# Patient Record
Sex: Male | Born: 1960 | Race: White | Hispanic: No | Marital: Married | Smoking: Former smoker
Health system: Southern US, Community
[De-identification: ages and names within clinical notes are randomized; demographics above are authoritative.]

## PROBLEM LIST (undated history)

## (undated) DIAGNOSIS — M1990 Unspecified osteoarthritis, unspecified site: Secondary | ICD-10-CM

## (undated) DIAGNOSIS — I219 Acute myocardial infarction, unspecified: Secondary | ICD-10-CM

## (undated) DIAGNOSIS — Z9889 Other specified postprocedural states: Secondary | ICD-10-CM

## (undated) DIAGNOSIS — I2510 Atherosclerotic heart disease of native coronary artery without angina pectoris: Secondary | ICD-10-CM

## (undated) DIAGNOSIS — I10 Essential (primary) hypertension: Secondary | ICD-10-CM

## (undated) DIAGNOSIS — I7090 Unspecified atherosclerosis: Secondary | ICD-10-CM

## (undated) HISTORY — PX: HERNIA REPAIR: SHX51

---

## 1998-06-14 DIAGNOSIS — M1990 Unspecified osteoarthritis, unspecified site: Secondary | ICD-10-CM

## 1998-06-14 HISTORY — DX: Unspecified osteoarthritis, unspecified site: M19.90

## 2005-04-05 ENCOUNTER — Other Ambulatory Visit: Payer: Self-pay

## 2005-04-06 ENCOUNTER — Observation Stay: Payer: Self-pay | Admitting: Internal Medicine

## 2005-04-22 ENCOUNTER — Ambulatory Visit: Payer: Self-pay | Admitting: Cardiovascular Disease

## 2005-05-27 ENCOUNTER — Emergency Department: Payer: Self-pay | Admitting: Emergency Medicine

## 2005-11-27 ENCOUNTER — Emergency Department: Payer: Self-pay | Admitting: Emergency Medicine

## 2006-04-16 ENCOUNTER — Observation Stay: Payer: Self-pay | Admitting: Internal Medicine

## 2006-04-16 ENCOUNTER — Other Ambulatory Visit: Payer: Self-pay

## 2007-01-16 ENCOUNTER — Emergency Department: Payer: Self-pay

## 2008-11-22 ENCOUNTER — Emergency Department: Payer: Self-pay | Admitting: Unknown Physician Specialty

## 2009-06-12 ENCOUNTER — Emergency Department: Payer: Self-pay | Admitting: Emergency Medicine

## 2010-01-14 ENCOUNTER — Emergency Department: Payer: Self-pay | Admitting: Internal Medicine

## 2010-06-14 DIAGNOSIS — Z9889 Other specified postprocedural states: Secondary | ICD-10-CM

## 2010-06-14 HISTORY — DX: Other specified postprocedural states: Z98.890

## 2010-06-16 ENCOUNTER — Observation Stay: Payer: Self-pay | Admitting: Internal Medicine

## 2010-06-24 ENCOUNTER — Ambulatory Visit: Payer: Self-pay | Admitting: Cardiovascular Disease

## 2010-06-25 ENCOUNTER — Observation Stay: Payer: Self-pay | Admitting: Internal Medicine

## 2011-05-12 ENCOUNTER — Ambulatory Visit: Payer: Self-pay | Admitting: Cardiovascular Disease

## 2015-12-17 ENCOUNTER — Inpatient Hospital Stay
Admission: EM | Admit: 2015-12-17 | Discharge: 2015-12-19 | DRG: 345 | Disposition: A | Discharge status: Home / Self Care | Payer: Self-pay | Attending: Surgery | Admitting: Surgery

## 2015-12-17 ENCOUNTER — Encounter: Payer: Self-pay | Admitting: Emergency Medicine

## 2015-12-17 DIAGNOSIS — I252 Old myocardial infarction: Secondary | ICD-10-CM

## 2015-12-17 DIAGNOSIS — K4091 Unilateral inguinal hernia, without obstruction or gangrene, recurrent: Secondary | ICD-10-CM | POA: Diagnosis present

## 2015-12-17 DIAGNOSIS — Y92234 Operating room of hospital as the place of occurrence of the external cause: Secondary | ICD-10-CM | POA: Diagnosis not present

## 2015-12-17 DIAGNOSIS — Z7982 Long term (current) use of aspirin: Secondary | ICD-10-CM

## 2015-12-17 DIAGNOSIS — N9981 Other intraoperative complications of genitourinary system: Secondary | ICD-10-CM | POA: Insufficient documentation

## 2015-12-17 DIAGNOSIS — F17210 Nicotine dependence, cigarettes, uncomplicated: Secondary | ICD-10-CM | POA: Diagnosis present

## 2015-12-17 DIAGNOSIS — N9971 Accidental puncture and laceration of a genitourinary system organ or structure during a genitourinary system procedure: Secondary | ICD-10-CM | POA: Diagnosis not present

## 2015-12-17 DIAGNOSIS — Y658 Other specified misadventures during surgical and medical care: Secondary | ICD-10-CM | POA: Diagnosis not present

## 2015-12-17 DIAGNOSIS — I2510 Atherosclerotic heart disease of native coronary artery without angina pectoris: Secondary | ICD-10-CM | POA: Diagnosis present

## 2015-12-17 DIAGNOSIS — I10 Essential (primary) hypertension: Secondary | ICD-10-CM | POA: Diagnosis present

## 2015-12-17 DIAGNOSIS — K4030 Unilateral inguinal hernia, with obstruction, without gangrene, not specified as recurrent: Secondary | ICD-10-CM | POA: Diagnosis present

## 2015-12-17 DIAGNOSIS — K4031 Unilateral inguinal hernia, with obstruction, without gangrene, recurrent: Principal | ICD-10-CM | POA: Diagnosis present

## 2015-12-17 HISTORY — DX: Unspecified atherosclerosis: I70.90

## 2015-12-17 HISTORY — DX: Atherosclerotic heart disease of native coronary artery without angina pectoris: I25.10

## 2015-12-17 HISTORY — DX: Acute myocardial infarction, unspecified: I21.9

## 2015-12-17 HISTORY — DX: Essential (primary) hypertension: I10

## 2015-12-17 LAB — CBC
HEMATOCRIT: 44.1 % (ref 40.0–52.0)
Hemoglobin: 15.1 g/dL (ref 13.0–18.0)
MCH: 31.3 pg (ref 26.0–34.0)
MCHC: 34.2 g/dL (ref 32.0–36.0)
MCV: 91.5 fL (ref 80.0–100.0)
Platelets: 190 10*3/uL (ref 150–440)
RBC: 4.82 MIL/uL (ref 4.40–5.90)
RDW: 13.4 % (ref 11.5–14.5)
WBC: 12 10*3/uL — ABNORMAL HIGH (ref 3.8–10.6)

## 2015-12-17 LAB — COMPREHENSIVE METABOLIC PANEL
ALBUMIN: 4.8 g/dL (ref 3.5–5.0)
ALK PHOS: 45 U/L (ref 38–126)
ALT: 16 U/L — ABNORMAL LOW (ref 17–63)
AST: 20 U/L (ref 15–41)
Anion gap: 9 (ref 5–15)
BILIRUBIN TOTAL: 0.7 mg/dL (ref 0.3–1.2)
BUN: 16 mg/dL (ref 6–20)
CALCIUM: 9 mg/dL (ref 8.9–10.3)
CO2: 24 mmol/L (ref 22–32)
Chloride: 106 mmol/L (ref 101–111)
Creatinine, Ser: 0.99 mg/dL (ref 0.61–1.24)
GFR calc Af Amer: >60 mL/min (ref >60)
GFR calc non Af Amer: >60 mL/min (ref >60)
GLUCOSE: 126 mg/dL — AB (ref 65–99)
POTASSIUM: 3.6 mmol/L (ref 3.5–5.1)
Sodium: 139 mmol/L (ref 135–145)
TOTAL PROTEIN: 7.4 g/dL (ref 6.5–8.1)

## 2015-12-17 LAB — LIPASE, BLOOD: Lipase: 26 U/L (ref 11–51)

## 2015-12-17 MED ORDER — MORPHINE SULFATE (PF) 4 MG/ML IV SOLN
4.0000 mg | Freq: Once | INTRAVENOUS | Status: AC
  Administered 2015-12-17: 4 mg via INTRAVENOUS
  Filled 2015-12-17: qty 1

## 2015-12-17 MED ORDER — ONDANSETRON HCL 4 MG/2ML IJ SOLN
4.0000 mg | INTRAMUSCULAR | Status: AC
  Administered 2015-12-17: 4 mg via INTRAVENOUS
  Filled 2015-12-17: qty 2

## 2015-12-17 NOTE — ED Notes (Addendum)
Pt ambulatory to triage with slow but steady gait, pt c/o pain to mid lower abdomen when laying flat began 22:00 tonight. Pt reports had bilateral hernia repair in 1992, located in mid abdomen. (+) nausea and vomiting x 3-4 today. Pt denies chest pain, diarrhea, urinary symptoms, or shortness of breath

## 2015-12-18 ENCOUNTER — Inpatient Hospital Stay: Payer: Self-pay | Admitting: Anesthesiology

## 2015-12-18 ENCOUNTER — Encounter: Payer: Self-pay | Admitting: Surgery

## 2015-12-18 ENCOUNTER — Encounter
Admission: EM | Disposition: A | Discharge status: Home / Self Care | Payer: Self-pay | Source: Home / Self Care | Attending: Surgery

## 2015-12-18 DIAGNOSIS — K4030 Unilateral inguinal hernia, with obstruction, without gangrene, not specified as recurrent: Secondary | ICD-10-CM | POA: Diagnosis present

## 2015-12-18 DIAGNOSIS — K4091 Unilateral inguinal hernia, without obstruction or gangrene, recurrent: Secondary | ICD-10-CM | POA: Diagnosis present

## 2015-12-18 DIAGNOSIS — N9981 Other intraoperative complications of genitourinary system: Secondary | ICD-10-CM | POA: Insufficient documentation

## 2015-12-18 HISTORY — PX: INGUINAL HERNIA REPAIR: SHX194

## 2015-12-18 HISTORY — PX: BLADDER REPAIR: SHX6721

## 2015-12-18 LAB — URINALYSIS COMPLETE WITH MICROSCOPIC (ARMC ONLY)
BILIRUBIN URINE: NEGATIVE
Bacteria, UA: NONE SEEN
GLUCOSE, UA: NEGATIVE mg/dL
LEUKOCYTES UA: NEGATIVE
NITRITE: NEGATIVE
Protein, ur: NEGATIVE mg/dL
SPECIFIC GRAVITY, URINE: 1.019 (ref 1.005–1.030)
SQUAMOUS EPITHELIAL / LPF: NONE SEEN
pH: 6 (ref 5.0–8.0)

## 2015-12-18 LAB — LACTIC ACID, PLASMA
LACTIC ACID, VENOUS: 1.5 mmol/L (ref 0.5–1.9)
Lactic Acid, Venous: 1.2 mmol/L (ref 0.5–1.9)

## 2015-12-18 SURGERY — REPAIR, HERNIA, INGUINAL, ADULT
Anesthesia: General | Site: Inguinal | Laterality: Right | Wound class: Clean Contaminated

## 2015-12-18 MED ORDER — ACETAMINOPHEN 10 MG/ML IV SOLN
INTRAVENOUS | Status: DC | PRN
  Administered 2015-12-18: 1000 mg via INTRAVENOUS

## 2015-12-18 MED ORDER — ENOXAPARIN SODIUM 40 MG/0.4ML ~~LOC~~ SOLN
40.0000 mg | SUBCUTANEOUS | Status: DC
Start: 2015-12-18 — End: 2015-12-19
  Administered 2015-12-18: 40 mg via SUBCUTANEOUS
  Filled 2015-12-18: qty 0.4

## 2015-12-18 MED ORDER — LIDOCAINE HCL (CARDIAC) 20 MG/ML IV SOLN
INTRAVENOUS | Status: DC | PRN
  Administered 2015-12-18: 100 mg via INTRAVENOUS

## 2015-12-18 MED ORDER — OXYCODONE HCL 5 MG PO TABS: 5.0000 mg | ORAL_TABLET | ORAL | Status: DC | PRN

## 2015-12-18 MED ORDER — KETOROLAC TROMETHAMINE 30 MG/ML IJ SOLN
30.0000 mg | Freq: Four times a day (QID) | INTRAMUSCULAR | Status: DC
  Administered 2015-12-18 – 2015-12-19 (×5): 30 mg via INTRAVENOUS
  Filled 2015-12-18 (×5): qty 1

## 2015-12-18 MED ORDER — 0.9 % SODIUM CHLORIDE (POUR BTL) OPTIME
TOPICAL | Status: DC | PRN
  Administered 2015-12-18: 1000 mL

## 2015-12-18 MED ORDER — ONDANSETRON HCL 4 MG/2ML IJ SOLN
4.0000 mg | Freq: Once | INTRAMUSCULAR | Status: DC | PRN
Start: 2015-12-18 — End: 2015-12-18

## 2015-12-18 MED ORDER — LACTATED RINGERS IV SOLN
INTRAVENOUS | Status: DC
  Administered 2015-12-18: 07:00:00 via INTRAVENOUS

## 2015-12-18 MED ORDER — FENTANYL CITRATE (PF) 100 MCG/2ML IJ SOLN
INTRAMUSCULAR | Status: DC | PRN
  Administered 2015-12-18 (×3): 50 ug via INTRAVENOUS
  Administered 2015-12-18: 100 ug via INTRAVENOUS

## 2015-12-18 MED ORDER — ACETAMINOPHEN 500 MG PO TABS
1000.0000 mg | ORAL_TABLET | Freq: Four times a day (QID) | ORAL | Status: DC
  Administered 2015-12-18 – 2015-12-19 (×5): 1000 mg via ORAL
  Filled 2015-12-18 (×6): qty 2

## 2015-12-18 MED ORDER — ACETAMINOPHEN 10 MG/ML IV SOLN
INTRAVENOUS | Status: AC
  Filled 2015-12-18: qty 100

## 2015-12-18 MED ORDER — ROCURONIUM BROMIDE 100 MG/10ML IV SOLN
INTRAVENOUS | Status: DC | PRN
  Administered 2015-12-18 (×3): 10 mg via INTRAVENOUS
  Administered 2015-12-18: 40 mg via INTRAVENOUS
  Administered 2015-12-18: 20 mg via INTRAVENOUS

## 2015-12-18 MED ORDER — BUPIVACAINE HCL (PF) 0.25 % IJ SOLN
INTRAMUSCULAR | Status: DC | PRN
  Administered 2015-12-18: 30 mL

## 2015-12-18 MED ORDER — HYDROMORPHONE HCL 1 MG/ML IJ SOLN
0.5000 mg | INTRAMUSCULAR | Status: DC | PRN
  Administered 2015-12-18: 0.5 mg via INTRAVENOUS
  Filled 2015-12-18: qty 1

## 2015-12-18 MED ORDER — METOPROLOL TARTRATE 25 MG PO TABS
25.0000 mg | ORAL_TABLET | Freq: Two times a day (BID) | ORAL | Status: DC
  Administered 2015-12-18 – 2015-12-19 (×3): 25 mg via ORAL
  Filled 2015-12-18 (×3): qty 1

## 2015-12-18 MED ORDER — CEFAZOLIN SODIUM 1 G IJ SOLR
INTRAMUSCULAR | Status: DC | PRN
  Administered 2015-12-18: 2 g via INTRAMUSCULAR

## 2015-12-18 MED ORDER — HYDROMORPHONE HCL 1 MG/ML IJ SOLN
1.0000 mg | INTRAMUSCULAR | Status: AC
  Administered 2015-12-18: 1 mg via INTRAVENOUS
  Filled 2015-12-18: qty 1

## 2015-12-18 MED ORDER — ONDANSETRON 4 MG PO TBDP
4.0000 mg | ORAL_TABLET | Freq: Four times a day (QID) | ORAL | Status: DC | PRN
  Filled 2015-12-18: qty 1

## 2015-12-18 MED ORDER — PNEUMOCOCCAL VAC POLYVALENT 25 MCG/0.5ML IJ INJ: 0.5000 mL | INJECTION | INTRAMUSCULAR | Status: DC

## 2015-12-18 MED ORDER — DEXAMETHASONE SODIUM PHOSPHATE 10 MG/ML IJ SOLN
INTRAMUSCULAR | Status: DC | PRN
  Administered 2015-12-18: 10 mg via INTRAVENOUS

## 2015-12-18 MED ORDER — PROPOFOL 10 MG/ML IV BOLUS
INTRAVENOUS | Status: DC | PRN
Start: 2015-12-18 — End: 2015-12-18
  Administered 2015-12-18: 150 mg via INTRAVENOUS

## 2015-12-18 MED ORDER — LACTATED RINGERS IV SOLN
INTRAVENOUS | Status: DC | PRN
  Administered 2015-12-18 (×2): via INTRAVENOUS

## 2015-12-18 MED ORDER — CEFAZOLIN (ANCEF) 1 G IV SOLR: 2.0000 g | INTRAVENOUS | Status: DC

## 2015-12-18 MED ORDER — SUGAMMADEX SODIUM 200 MG/2ML IV SOLN
INTRAVENOUS | Status: DC | PRN
  Administered 2015-12-18: 187.8 mg via INTRAVENOUS

## 2015-12-18 MED ORDER — CYCLOBENZAPRINE HCL 10 MG PO TABS
10.0000 mg | ORAL_TABLET | Freq: Three times a day (TID) | ORAL | Status: DC
  Administered 2015-12-18 – 2015-12-19 (×4): 10 mg via ORAL
  Filled 2015-12-18 (×4): qty 1

## 2015-12-18 MED ORDER — FENTANYL CITRATE (PF) 100 MCG/2ML IJ SOLN: 25.0000 ug | INTRAMUSCULAR | Status: DC | PRN

## 2015-12-18 MED ORDER — HYDRALAZINE HCL 20 MG/ML IJ SOLN: 10.0000 mg | INTRAMUSCULAR | Status: DC | PRN

## 2015-12-18 MED ORDER — BUPIVACAINE HCL (PF) 0.25 % IJ SOLN
INTRAMUSCULAR | Status: AC
  Filled 2015-12-18: qty 30

## 2015-12-18 MED ORDER — ASPIRIN 81 MG PO CHEW
81.0000 mg | CHEWABLE_TABLET | Freq: Every day | ORAL | Status: DC
  Administered 2015-12-18 – 2015-12-19 (×2): 81 mg via ORAL
  Filled 2015-12-18 (×2): qty 1

## 2015-12-18 MED ORDER — MIDAZOLAM HCL 2 MG/2ML IJ SOLN
INTRAMUSCULAR | Status: DC | PRN
Start: 2015-12-18 — End: 2015-12-18
  Administered 2015-12-18: 2 mg via INTRAVENOUS

## 2015-12-18 MED ORDER — ONDANSETRON HCL 4 MG/2ML IJ SOLN
4.0000 mg | Freq: Four times a day (QID) | INTRAMUSCULAR | Status: DC | PRN
Start: 2015-12-18 — End: 2015-12-19
  Administered 2015-12-18: 4 mg via INTRAVENOUS

## 2015-12-18 MED ORDER — SUCCINYLCHOLINE CHLORIDE 20 MG/ML IJ SOLN
INTRAMUSCULAR | Status: DC | PRN
  Administered 2015-12-18: 100 mg via INTRAVENOUS

## 2015-12-18 SURGICAL SUPPLY — 38 items
BLADE SURG 15 STRL LF DISP TIS (BLADE) ×2 IMPLANT
BLADE SURG 15 STRL SS (BLADE) ×2
CANISTER SUCT 1200ML W/VALVE (MISCELLANEOUS) ×4 IMPLANT
CHLORAPREP W/TINT 26ML (MISCELLANEOUS) ×8 IMPLANT
DRAIN PENROSE 1/4X12 LTX (DRAIN) ×4 IMPLANT
DRAPE LAPAROTOMY TRNSV 106X77 (MISCELLANEOUS) ×4 IMPLANT
ELECT CAUTERY BLADE 6.4 (BLADE) ×4 IMPLANT
ELECT REM PT RETURN 9FT ADLT (ELECTROSURGICAL) ×4
ELECTRODE REM PT RTRN 9FT ADLT (ELECTROSURGICAL) ×2 IMPLANT
GLOVE BIOGEL PI IND STRL 7.0 (GLOVE) ×2 IMPLANT
GLOVE BIOGEL PI INDICATOR 7.0 (GLOVE) ×2
GLOVE PI ORTHOPRO 6.5 (GLOVE) ×2
GLOVE PI ORTHOPRO STRL 6.5 (GLOVE) ×2 IMPLANT
GLOVE SURG LX 7.0 MICRO (GLOVE) ×4
GLOVE SURG LX STRL 7.0 MICRO (GLOVE) ×4 IMPLANT
GOWN STRL REUS W/ TWL LRG LVL3 (GOWN DISPOSABLE) ×4 IMPLANT
GOWN STRL REUS W/TWL LRG LVL3 (GOWN DISPOSABLE) ×4
LABEL OR SOLS (LABEL) ×4 IMPLANT
LIQUID BAND (GAUZE/BANDAGES/DRESSINGS) ×4 IMPLANT
MESH PARIETEX PROGRIP RIGHT (Mesh General) ×4 IMPLANT
NEEDLE HYPO 25X1 1.5 SAFETY (NEEDLE) ×4 IMPLANT
NS IRRIG 1000ML POUR BTL (IV SOLUTION) ×4 IMPLANT
PACK BASIN MINOR ARMC (MISCELLANEOUS) ×4 IMPLANT
SPONGE LAP 18X18 5 PK (GAUZE/BANDAGES/DRESSINGS) ×8 IMPLANT
SUT MNCRL 3-0 UNDYED SH (SUTURE) ×2 IMPLANT
SUT MNCRL 4-0 (SUTURE) ×2
SUT MNCRL 4-0 27XMFL (SUTURE) ×2
SUT MON AB 3-0 SH 27 (SUTURE) ×4 IMPLANT
SUT MONOCRYL 3-0 UNDYED (SUTURE) ×2
SUT PROLENE 2 0 SH DA (SUTURE) ×4 IMPLANT
SUT SILK 2 0 SH (SUTURE) IMPLANT
SUT VIC AB 2-0 BRD 54 (SUTURE) ×4 IMPLANT
SUT VIC AB 2-0 SH 27 (SUTURE) ×6
SUT VIC AB 2-0 SH 27XBRD (SUTURE) ×6 IMPLANT
SUT VIC AB 3-0 SH 27 (SUTURE)
SUT VIC AB 3-0 SH 27X BRD (SUTURE) IMPLANT
SUTURE MNCRL 4-0 27XMF (SUTURE) ×2 IMPLANT
SYRINGE 10CC LL (SYRINGE) ×4 IMPLANT

## 2015-12-18 NOTE — ED Provider Notes (Signed)
[facility] Emergency Department Provider Note  ____________________________________________  Time seen: Approximately 12:06 AM  I have reviewed the triage vital signs and the nursing notes.   HISTORY  Chief Complaint Abdominal Pain    HPI Elijah Fitzpatrick is a 55 y.o. male with a history of bilateral inguinal hernia repairs with mesh about 20 years ago who presents with acute onset of right groin pain and swelling.  He was not exerting himself, just lying flat tonight, when he developed pain in his lower abdomen and felt a large bulge in the groin.  He said that sometimes he feels the hernia comes out a little bit but it is usually soft and he is able to push it back, but this time it is large, hard, and will not move when he pushes on it.  The pain is severe and he has had multiple episodes of vomiting with persistent nausea.  He denies passing any gas or having a bowel movement since the development of the hernia.  He states that his the pain as sharp and aching and worse with movement and exertion.  He denies fever/chills, chest pain, shortness of breath, dysuria.   Past Medical History  Diagnosis Date  . Blocked artery (HCC)   . Coronary artery disease   . Hypertension     There are no active problems to display for this patient.   Past Surgical History  Procedure Laterality Date  . Hernia repair      No current outpatient prescriptions on file.  Allergies Review of patient's allergies indicates no known allergies.  No family history on file.  Social History Social History  Substance Use Topics  . Smoking status: Current Every Day Smoker  . Smokeless tobacco: None  . Alcohol Use: No    Review of Systems Constitutional: No fever/chills Eyes: No visual changes. ENT: No sore throat. Cardiovascular: Denies chest pain. Respiratory: Denies shortness of breath. Gastrointestinal: Pain in the lower abdomen worse on the right with a large  bulge in his right groin.  Several episodes of vomiting.  Has not had a bowel movement or passed gas since the development of the hernia Genitourinary: Negative for dysuria. Musculoskeletal: Negative for back pain. Skin: Negative for rash. Neurological: Negative for headaches, focal weakness or numbness.  10-point ROS otherwise negative.  ____________________________________________   PHYSICAL EXAM:  VITAL SIGNS: ED Triage Vitals  Enc Vitals Group     BP 12/17/15 2318 165/88 mmHg     Pulse Rate 12/17/15 2318 49     Resp 12/17/15 2318 18     Temp 12/17/15 2318 97.5 F (36.4 C)     Temp Source 12/17/15 2318 Oral     SpO2 12/17/15 2318 99 %     Weight 12/17/15 2318 207 lb (93.895 kg)     Height 12/17/15 2318 6' (1.829 m)     Head Cir --      Peak Flow --      Pain Score 12/17/15 2319 10     Pain Loc --      Pain Edu? --      Excl. in GC? --     Constitutional: Alert and oriented. Nontoxic but appears uncomfortable Eyes: Conjunctivae are normal. PERRL. EOMI. Head: Atraumatic. Nose: No congestion/rhinnorhea. Mouth/Throat: Mucous membranes are moist.  Oropharynx non-erythematous. Neck: No stridor.  No meningeal signs.   Cardiovascular: Bradycardia, regular rhythm. Good peripheral circulation. Grossly normal heart sounds.   Respiratory: Normal respiratory effort.  No retractions.  Lungs CTAB. Gastrointestinal: Soft and mild tenderness diffusely in the lower abdomen.  There is a fist size area of swelling in the right groin consistent with an inguinal hernia.  It is hard to palpation and tender. Musculoskeletal: No lower extremity tenderness nor edema. No gross deformities of extremities. Neurologic:  Normal speech and language. No gross focal neurologic deficits are appreciated.  Skin:  Skin is warm, dry and intact. No rash noted. Psychiatric: Mood and affect are normal. Speech and behavior are normal.  ____________________________________________   LABS (all labs ordered  are listed, but only abnormal results are displayed)  Labs Reviewed  COMPREHENSIVE METABOLIC PANEL - Abnormal; Notable for the following:    Glucose, Bld 126 (*)    ALT 16 (*)    All other components within normal limits  CBC - Abnormal; Notable for the following:    WBC 12.0 (*)    All other components within normal limits  URINALYSIS COMPLETEWITH MICROSCOPIC ([facility] ONLY) - Abnormal; Notable for the following:    Color, Urine YELLOW (*)    APPearance CLEAR (*)    Ketones, ur TRACE (*)    Hgb urine dipstick 1+ (*)    All other components within normal limits  LIPASE, BLOOD  LACTIC ACID, PLASMA  LACTIC ACID, PLASMA   ____________________________________________  EKG  ED ECG REPORT I, Mikias Lanz, the attending physician, personally viewed and interpreted this ECG.  Date: 12/18/2015 EKG Time: 01:19 Rate: 61 Rhythm: normal sinus rhythm QRS Axis: normal Intervals: normal ST/T Wave abnormalities: Non-specific ST segment / T-wave changes, but no evidence of acute ischemia. Conduction Disturbances: none Narrative Interpretation: unremarkable  ____________________________________________  RADIOLOGY   No results found.  ____________________________________________   PROCEDURES  Procedure(s) performed:   Procedures   ____________________________________________   INITIAL IMPRESSION / ASSESSMENT AND PLAN / ED COURSE  Pertinent labs & imaging results that were available during my care of the patient were reviewed by me and considered in my medical decision making (see chart for details).  I am concerned that this patient has an incarcerated inguinal hernia.  There is no discoloration such as a bluish purple tinge to the skin but it is acute in onset, only been present for a couple of hours.  His vomiting and not passing gas or having a bowel movement may suggest a complete obstruction.  The pain is severe, the hernia is very firm to palpation, and I was not able to  reduce it; after one round of morphine 4 mg IV, I placed the patient in Trendelenburg position and applied steady but firm pressure and was not able to make any progress in reducing it.  As a results and my concern about the possibility of the patient developing bowel necrosis, I called and spoke with phone with Dr. Orvis BrillLoflin the on-call surgeon and she is coming to the emergency department to evaluate the patient in person.  ----------------------------------------- 1:23 AM on 12/18/2015 -----------------------------------------  Dr. Orvis BrillLoflin evaluated the patient at bedside and was not successful reducing the incarcerated hernia.  She is going to take the patient to the operating room for repair.  ____________________________________________  FINAL CLINICAL IMPRESSION(S) / ED DIAGNOSES  Final diagnoses:  Incarcerated right inguinal hernia     MEDICATIONS GIVEN DURING THIS VISIT:  Medications  morphine 4 MG/ML injection 4 mg (4 mg Intravenous Given 12/17/15 2355)  ondansetron (ZOFRAN) injection 4 mg (4 mg Intravenous Given 12/17/15 2355)  HYDROmorphone (DILAUDID) injection 1 mg (1 mg Intravenous Given 12/18/15 0022)  NEW OUTPATIENT MEDICATIONS STARTED DURING THIS VISIT:  New Prescriptions   No medications on file      Note:  This document was prepared using Dragon voice recognition software and may include unintentional dictation errors.   Loleta Rose, MD 12/18/15 [phone]

## 2015-12-18 NOTE — Transfer of Care (Signed)
Immediate Anesthesia Transfer of Care Note  Patient: Elijah Fitzpatrick  Procedure(s) Performed: Procedure(s): HERNIA REPAIR INGUINAL ADULT (Right) BLADDER REPAIR  Patient Location: PACU  Anesthesia Type:General  Level of Consciousness: sedated  Airway & Oxygen Therapy: Patient Spontanous Breathing and Patient connected to face mask oxygen  Post-op Assessment: Report given to RN and Post -op Vital signs reviewed and stable  Post vital signs: Reviewed and stable  Last Vitals:  Filed Vitals:   12/17/15 2345 12/18/15 0115  BP: 172/92 161/91  Pulse: 50 52  Temp:    Resp: 14 14    Last Pain:  Filed Vitals:   12/18/15 0126  PainSc: 7          Complications: No apparent anesthesia complications

## 2015-12-18 NOTE — Anesthesia Postprocedure Evaluation (Signed)
Anesthesia Post Note  Patient: Elijah Fitzpatrick  Procedure(s) Performed: Procedure(s) (LRB): HERNIA REPAIR INGUINAL ADULT (Right) BLADDER REPAIR  Patient location during evaluation: PACU Anesthesia Type: General Level of consciousness: awake and alert Pain management: pain level controlled Vital Signs Assessment: post-procedure vital signs reviewed and stable Respiratory status: spontaneous breathing, nonlabored ventilation, respiratory function stable and patient connected to nasal cannula oxygen Cardiovascular status: blood pressure returned to baseline and stable Postop Assessment: no signs of nausea or vomiting Anesthetic complications: no    Last Vitals:  Filed Vitals:   12/18/15 0653 12/18/15 0800  BP: 146/82 159/78  Pulse: 62 64  Temp: 36.4 C 36.6 C  Resp: 16 17    Last Pain:  Filed Vitals:   12/18/15 0822  PainSc: 0-No pain                 Lenard SimmerAndrew Anaja Monts

## 2015-12-18 NOTE — Clinical Social Work Note (Signed)
CSW received consult for medication assistance. RN CM will see patient for this concern. Please re-consult CSW if need arises. York SpanielMonica Berenice Oehlert MSW,LCSW [phone]

## 2015-12-18 NOTE — Op Note (Signed)
Herniorrhaphy Procedure Note  Indications: The patient has a strangulated   right inguinal hernia.   Pre-operative Diagnosis: right incarcerated inguinal hernia  Post-operative Diagnosis: 1. right incarcerated inguinal hernia 2. Bladder injury  Procedure:  1. Open right inguinal hernia repair 2. Repair of bladder injury in multiple layers 3. Placement of Paritex mesh  Surgeon: Gladis Riffleatherine L Mary-Ann Pennella   Assistants: none  Anesthesia: General endotracheal anesthesia  ASA Class: 2  Procedure Details  The patient was seen in the Holding Room. The risks, benefits, complications, treatment options, and expected outcomes were discussed with the patient. The possibilities of reaction to medication, pain, infection, bleeding, heart attack, death, injury to internal organs, recurrence, testicular damage, infertility, or need for further surgery were discussed with the patient. The patient concurred with the proposed plan, giving informed consent.  The site of surgery properly noted/marked. The patient was taken to Operating Room, identified as Elijah Fitzpatrick and the procedure verified as Right inguinal hernia repair. A Time Out was held and the above information confirmed.  The patient was placed supine on the operating room table. General anesthesia was induced and the patient was intubated without any difficulty. After the patient was anesthetized, a foley catheter and SCDs were placed and  the right groin was prepped and draped in the standard fashion. Marcaine 0.5% with epinephrine was used to anesthetize the skin over the inguinal ligament.    A low transverse incision was created in the right groin over the area of previous scar in this area, carried down throught the subcutaneious tissues with cautery.  The hernia sac and contents were encountered prior to encountering the external oblique. Scar tissue and subcutaneous tissues gently dissected off the hernia sac area and dissected down to this the  fascial area. At this time careful dissection around the area of the fascia was performed. The cord structures were identified and were retracted laterally after carefully dissecting them off of the hernia sac. An direct hernia was identified, with the external ring well intact but a very large medial defect with a very large hernia sac and contents.  The external oblique was then incised medially to over the pubic tubercle and was attempted to be excised laterally however previous mesh was over this area of very thick scar tissue. This point the incision was then carried further medially.   The hernia sac was mobilized from all the contents and found to have a large amount of omentum and preperitoneal fat surrounding the sac. The fat was mobilized with high ligation and was placed back into the abdomen.  The sac was opened and found to contain no bowel. A second saclike structure inferior to the first was visible and ensured that this was not a femoral hernia.  During dissection of the hernia sac clear yellow liquid was seen in and discovered to be a bladder injury.  At this time the area identified as the bladder was then closed in 3 layers of running locking 3-0 Vicryl, ensuring good mucosal approximation with the first layer and then good muscular approximation with the second and then covering it with a third layer of both muscle and some of the preperitoneal fat.    Attention was then turned back to the sac where a high ligation was performed and the sac was submitted to pathology.  The external ring was then closed with a running 2-0 Prolene suture, approximating the conjoined tendon to the medial tissue along the inguinal ligament. A Lichtenstein-type repair with polypropylene  mesh in a keyhole fashion was used. The mesh was self fixating but was sutured in medially over the pubic tubercle and inguinal ligament with interrupted 2-0 prolene sutures. The spermatic cord was passed through an opening in the  mesh laterally and moved freely in the mesh opening which admitted the tip of the index finger. The cord structures were returned to their anatomic locations.  The external aponeurosis was closed with a running 3-0 Monocryl suture.   Scarpa's fascia and Subcutaneous layer were then closed using two running 3-0 Monocryl and the skin incision was closed in layers with a 4-0 Vicryl subcuticular closure.  Sterile glue was applied to skin as dressing.   All instruments and sponge counts were correct at the end of the case.  Findings: right direct hernia, injury to bladder closed in layers  Estimated Blood Loss:  150cc         Drains: none         Total IV Fluids:         Specimens: hernia sac         Implants: Paritex self fixating mesh         Complications:  None; patient tolerated the procedure well.         Disposition: PACU - hemodynamically stable.         Condition: stable

## 2015-12-18 NOTE — H&P (Addendum)
Elijah Fitzpatrick is an 55 y.o. male.   Chief Complaint: right groin pain HPI: 55 yr old male with complaint of right groin pain starting about 10 pm that is 10 of 10 pain. Patient states that he had a hernia in this area in the 90s and had it repaired open bilateral at [facility].  He states that he would notice a bulge there occasionally for the past couple years and it would be worse toward the end of the day. He states that tonight it just started hurting, he does not remember lifting, straining, coughing or any inciting event.  He states that he was nauseated with the pain as well but no vomiting.  He had a BM earlier today and has been passing gas.  He ate about 5pm.  He has had a colonoscopy about 2 yr prior which did not show any polyps.  He does have a history of MI in 2013 with blockage in 3 arteries but he just takes medication, he did not have surgery or stent placement.  He was on blood thinners but states that he cannot afford this so has not taken them for some time.  He does take aspirin and metoprolol.   Past Medical History  Diagnosis Date  . Blocked artery ([place])   . Coronary artery disease   . Hypertension   . Myocardial infarction ([place])     x3 in 2013    Past Surgical History  Procedure Laterality Date  . Hernia repair      Bilateral open in 1990s    Family History  Problem Relation Age of Onset  . Heart disease Father   . Cancer Maternal Uncle    Social History:  reports that he has been smoking.  He has quit using smokeless tobacco. He reports that he does not drink alcohol or use illicit drugs.  Allergies: No Known Allergies   (Not in a hospital admission)  Results for orders placed or performed during the hospital encounter of 12/17/15 (from the past 48 hour(s))  Lipase, blood     Status: None   Collection Time: 12/17/15 11:21 PM  Result Value Ref Range   Lipase 26 11 - 51 U/L  Comprehensive metabolic panel     Status: Abnormal   Collection Time: 12/17/15 11:21 PM   Result Value Ref Range   Sodium 139 135 - 145 mmol/L   Potassium 3.6 3.5 - 5.1 mmol/L   Chloride 106 101 - 111 mmol/L   CO2 24 22 - 32 mmol/L   Glucose, Bld 126 (H) 65 - 99 mg/dL   BUN 16 6 - 20 mg/dL   Creatinine, Ser 0.99 0.61 - 1.24 mg/dL   Calcium 9.0 8.9 - 10.3 mg/dL   Total Protein 7.4 6.5 - 8.1 g/dL   Albumin 4.8 3.5 - 5.0 g/dL   AST 20 15 - 41 U/L   ALT 16 (L) 17 - 63 U/L   Alkaline Phosphatase 45 38 - 126 U/L   Total Bilirubin 0.7 0.3 - 1.2 mg/dL   GFR calc non Af Amer >60 >60 mL/min   GFR calc Af Amer >60 >60 mL/min    Comment: (NOTE) The eGFR has been calculated using the CKD EPI equation. This calculation has not been validated in all clinical situations. eGFR's persistently <60 mL/min signify possible Chronic Kidney Disease.    Anion gap 9 5 - 15  CBC     Status: Abnormal   Collection Time: 12/17/15 11:21 PM  Result Value  Ref Range   WBC 12.0 (H) 3.8 - 10.6 K/uL   RBC 4.82 4.40 - 5.90 MIL/uL   Hemoglobin 15.1 13.0 - 18.0 g/dL   HCT 44.1 40.0 - 52.0 %   MCV 91.5 80.0 - 100.0 fL   MCH 31.3 26.0 - 34.0 pg   MCHC 34.2 32.0 - 36.0 g/dL   RDW 13.4 11.5 - 14.5 %   Platelets 190 150 - 440 K/uL  Urinalysis complete, with microscopic     Status: Abnormal   Collection Time: 12/17/15 11:28 PM  Result Value Ref Range   Color, Urine YELLOW (A) YELLOW   APPearance CLEAR (A) CLEAR   Glucose, UA NEGATIVE NEGATIVE mg/dL   Bilirubin Urine NEGATIVE NEGATIVE   Ketones, ur TRACE (A) NEGATIVE mg/dL   Specific Gravity, Urine 1.019 1.005 - 1.030   Hgb urine dipstick 1+ (A) NEGATIVE   pH 6.0 5.0 - 8.0   Protein, ur NEGATIVE NEGATIVE mg/dL   Nitrite NEGATIVE NEGATIVE   Leukocytes, UA NEGATIVE NEGATIVE   RBC / HPF 0-5 0 - 5 RBC/hpf   WBC, UA 0-5 0 - 5 WBC/hpf   Bacteria, UA NONE SEEN NONE SEEN   Squamous Epithelial / LPF NONE SEEN NONE SEEN   Mucous PRESENT    No results found.  Review of Systems  Constitutional: Negative for fever and chills.  HENT: Negative for  congestion and sore throat.   Respiratory: Negative for cough, sputum production, shortness of breath and wheezing.   Cardiovascular: Negative for chest pain, palpitations and leg swelling.  Gastrointestinal: Positive for nausea and abdominal pain. Negative for vomiting, diarrhea, constipation and blood in stool.  Genitourinary: Negative for dysuria, urgency, frequency, hematuria and flank pain.  Musculoskeletal: Negative for back pain, joint pain and neck pain.  Skin: Negative for itching and rash.  Neurological: Negative for dizziness, loss of consciousness, weakness and headaches.  Psychiatric/Behavioral: Negative for depression. The patient is not nervous/anxious.   All other systems reviewed and are negative.   Blood pressure 161/91, pulse 52, temperature 97.5 F (36.4 C), temperature source Oral, resp. rate 14, height 6' (1.829 m), weight 207 lb (93.895 kg), SpO2 97 %. Physical Exam  Vitals reviewed. Constitutional: He is oriented to person, place, and time. He appears well-developed and well-nourished. No distress.  HENT:  Head: Normocephalic and atraumatic.  Right Ear: External ear normal.  Left Ear: External ear normal.  Nose: Nose normal.  Mouth/Throat: Oropharynx is clear and moist. No oropharyngeal exudate.  Eyes: Conjunctivae and EOM are normal. Pupils are equal, round, and reactive to light. No scleral icterus.  Neck: Normal range of motion. Neck supple. No tracheal deviation present.  Cardiovascular: Normal rate, normal heart sounds and intact distal pulses.  Exam reveals no gallop and no friction rub.   No murmur heard. Respiratory: Effort normal and breath sounds normal. No respiratory distress. He has no wheezes. He has no rales.  GI: Soft. Bowel sounds are normal. He exhibits no distension. There is tenderness. There is no rebound and no guarding.  Genitourinary:  Right inguinal hernia about 5cm in size, after placing in trendelenburg and attempting reduction it did  partially reduce to about 3cm but do feel as if area is strangulated, well healed scar  Musculoskeletal: Normal range of motion. He exhibits no edema or tenderness.  Neurological: He is alert and oriented to person, place, and time. No cranial nerve deficit.  Skin: Skin is warm and dry. No rash noted. No erythema. No pallor.  Psychiatric: He  has a normal mood and affect. His behavior is normal. Judgment and thought content normal.     Assessment/Plan 55 yr old male with strangulated recurrent right inguinal hernia.  Given that an attempt was made for about 45 mins to reduce the hernia and seems to be strangulated, the best course of action to ensure bowel viability is to proceed to the OR emergently for right open inguinal hernia repair.  I did discuss with the patient that if we have to remove bowel we may have to open the incision larger or in a different area.  Also discussed the risk of surgery including recurrence which can be up to 30% in the case of complex hernias, use of prosthetic materials (mesh) and the increased risk of infxn, post-op infxn and the possible need for re-operation and removal of mesh if used, possibility of post-op SBO or ileus, and the risks of general anesthetic including MI, CVA, sudden death or even reaction to anesthetic medications. The patient understands the risks, any and all questions were answered to the patient's satisfaction and consent was signed.  We will proceed to the OR emergently for open right inguinal hernia repair possible bowel resection.    Hubbard Robinson, MD 12/18/2015, 1:36 AM

## 2015-12-18 NOTE — ED Notes (Signed)
Surgery consult at bedside.

## 2015-12-18 NOTE — Anesthesia Preprocedure Evaluation (Addendum)
Anesthesia Evaluation  Patient identified by MRN, date of birth, ID band Patient awake    Reviewed: Allergy & Precautions, H&P , NPO status , Patient's Chart, lab work & pertinent test results, reviewed documented beta blocker date and time   History of Anesthesia Complications Negative for: history of anesthetic complications  Airway Mallampati: I  TM Distance: >3 FB Neck ROM: full    Dental no notable dental hx. (+) Edentulous Upper, Edentulous Lower   Pulmonary neg shortness of breath, neg sleep apnea, COPD, neg recent URI, Current Smoker,    Pulmonary exam normal breath sounds clear to auscultation       Cardiovascular Exercise Tolerance: Good hypertension, (-) angina+ CAD and + Past MI  (-) Cardiac Stents and (-) CABG Normal cardiovascular exam(-) dysrhythmias (-) Valvular Problems/Murmurs Rhythm:regular Rate:Normal     Neuro/Psych negative neurological ROS  negative psych ROS   GI/Hepatic Neg liver ROS, GERD  ,  Endo/Other  negative endocrine ROS  Renal/GU negative Renal ROS  negative genitourinary   Musculoskeletal   Abdominal   Peds  Hematology negative hematology ROS (+)   Anesthesia Other Findings Past Medical History:   Blocked artery (HCC)                                         Coronary artery disease                                      Hypertension                                                 Myocardial infarction [facility](HCC)                                    Comment:x3 in 2013  Patient with incarcerated hernia presenting for emergent inguinal hernia repair.  Admits to untreated CAD, denies drug use.  Plan for RSI.  Reproductive/Obstetrics negative OB ROS                            Anesthesia Physical Anesthesia Plan  ASA: III and emergent  Anesthesia Plan: General, Rapid Sequence and Cricoid Pressure   Post-op Pain Management:    Induction:   Airway Management  Planned:   Additional Equipment:   Intra-op Plan:   Post-operative Plan:   Informed Consent: I have reviewed the patients History and Physical, chart, labs and discussed the procedure including the risks, benefits and alternatives for the proposed anesthesia with the patient or authorized representative who has indicated his/her understanding and acceptance.   Dental Advisory Given  Plan Discussed with: Anesthesiologist, CRNA and Surgeon  Anesthesia Plan Comments:         Anesthesia Quick Evaluation

## 2015-12-18 NOTE — Progress Notes (Signed)
Patient is anxious to go back to work and somewhat angry that he is in the hospital.  He has no other complaints at this time and is tolerating a clear liquid diet. Wound is clean without erythema Foley catheter in place.  Patient doing quite well he did have a bladder injury as the bladder was apparently in the hernia sac and this was primarily repaired in layers. The Foley catheter will need to be in place for at least 7 days and will he will be sent home with a catheter I discussed this with him and ordered a Foley leg bag so that he can get used to wearing it. 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 This is discussed with him and he understood and reluctantly agreed. We'll advance diet

## 2015-12-18 NOTE — Anesthesia Procedure Notes (Addendum)
Procedure Name: Intubation Date/Time: 12/18/2015 2:34 AM Performed by: Junious SilkNOLES, Porchea Charrier Pre-anesthesia Checklist: Patient identified, Patient being monitored, Timeout performed, Emergency Drugs available and Suction available Patient Re-evaluated:Patient Re-evaluated prior to inductionOxygen Delivery Method: Circle system utilized Preoxygenation: Pre-oxygenation with 100% oxygen Intubation Type: IV induction, Rapid sequence and Cricoid Pressure applied Laryngoscope Size: Mac and 3 Grade View: Grade I Tube type: Oral Tube size: 7.5 mm Number of attempts: 1 Airway Equipment and Method: Stylet Placement Confirmation: ETT inserted through vocal cords under direct vision,  positive ETCO2 and breath sounds checked- equal and bilateral Secured at: 25 cm Tube secured with: Tape Dental Injury: Teeth and Oropharynx as per pre-operative assessment

## 2015-12-19 LAB — CBC WITH DIFFERENTIAL/PLATELET
Basophils Absolute: 0 10*3/uL (ref 0–0.1)
Basophils Relative: 0 %
EOS PCT: 1 %
Eosinophils Absolute: 0.1 10*3/uL (ref 0–0.7)
HEMATOCRIT: 41.5 % (ref 40.0–52.0)
HEMOGLOBIN: 14.5 g/dL (ref 13.0–18.0)
LYMPHS ABS: 2.4 10*3/uL (ref 1.0–3.6)
LYMPHS PCT: 17 %
MCH: 32.5 pg (ref 26.0–34.0)
MCHC: 35 g/dL (ref 32.0–36.0)
MCV: 92.6 fL (ref 80.0–100.0)
Monocytes Absolute: 1.1 10*3/uL — ABNORMAL HIGH (ref 0.2–1.0)
Monocytes Relative: 8 %
NEUTROS ABS: 10.2 10*3/uL — AB (ref 1.4–6.5)
Neutrophils Relative %: 74 %
PLATELETS: 175 10*3/uL (ref 150–440)
RBC: 4.48 MIL/uL (ref 4.40–5.90)
RDW: 13.8 % (ref 11.5–14.5)
WBC: 13.8 10*3/uL — AB (ref 3.8–10.6)

## 2015-12-19 LAB — SURGICAL PATHOLOGY

## 2015-12-19 MED ORDER — METOPROLOL TARTRATE 25 MG PO TABS: 25.0000 mg | ORAL_TABLET | Freq: Two times a day (BID) | ORAL | Status: DC

## 2015-12-19 MED ORDER — OXYCODONE HCL 5 MG PO TABS: 5.0000 mg | ORAL_TABLET | ORAL | Status: DC | PRN

## 2015-12-19 NOTE — Progress Notes (Signed)
Pt stable. IV removed. Foley catheter supplies given. Education on foley care given. D/c instructions given and education provided. Signed prescriptions verified and given. Pt states he understands instructions. Pt dressed and escorted out by staff. Driven home by family.

## 2015-12-19 NOTE — Discharge Instructions (Signed)
Regular diet No lifting No working Routine Foley catheter care with leg bag Cystogram scheduled for Wednesday at [facility] Follow-up with Dr. Orvis BrillLoflin on Thursday May shower

## 2015-12-19 NOTE — Care Management (Signed)
Patient admitted status post Open right inguinal hernia repair 2. Repair of bladder injury in multiple layers 3. Placement of Paritex mesh.  Patient is self pay patient.  Patient lives at home with his wife.  Patient is employeed, however his employer does not offer health insurance. Patient to be discharged on lopressor, and oxycodone.  Lopressor is $4, and oxycodone is $7.60 with coupon from [email or web address].  Patient states that he will be able to pay out these out of pocket.  Patient states that he has looked into applying for VA benefits however he was denied "because i make to much".  I have provided him an application for [facility], but I have informed him that he may not qualify due to his income. RNCM signing off.

## 2015-12-19 NOTE — Progress Notes (Signed)
1 Day Post-Op  Subjective: Patient feels very well today minimal pain no nausea vomiting tolerating a diet and wants to go home.  Objective: Vital signs in last 24 hours: Temp:  [97.6 F (36.4 C)-98.5 F (36.9 C)] 97.6 F (36.4 C) (07/07 0450) Pulse Rate:  [50-73] 50 (07/07 0450) Resp:  [14-17] 17 (07/07 0450) BP: (108-123)/(56-71) 116/71 mmHg (07/07 0450) SpO2:  [97 %-100 %] 97 % (07/07 0450) Last BM Date: 12/17/15  Intake/Output from previous day: 07/06 0701 - 07/07 0700 In: 820 [P.O.:360; I.V.:460] Out: 2200 [Urine:2200] Intake/Output this shift: Total I/O In: 240 [P.O.:240] Out: -   Physical exam:  Wound is clean without erythema or drainage Foley catheter is in place with a leg bag no hematuria Nontender calves  Lab Results: CBC   Recent Labs  12/17/15 2321 12/19/15 0509  WBC 12.0* 13.8*  HGB 15.1 14.5  HCT 44.1 41.5  PLT 190 175   BMET  Recent Labs  12/17/15 2321  NA 139  K 3.6  CL 106  CO2 24  GLUCOSE 126*  BUN 16  CREATININE 0.99  CALCIUM 9.0   PT/INR No results for input(s): LABPROT, INR in the last 72 hours. ABG No results for input(s): PHART, HCO3 in the last 72 hours.  Invalid input(s): PCO2, PO2  Studies/Results: No results found.  Anti-infectives: Anti-infectives    Start     Dose/Rate Route Frequency Ordered Stop   12/18/15 0215  ceFAZolin (ANCEF) powder 2 g  Status:  Discontinued     2 g Other To Surgery 12/18/15 0200 12/18/15 [id number]      Assessment/Plan: s/p Procedure(s): HERNIA REPAIR INGUINAL ADULT BLADDER REPAIR   Blood cell count slightly elevated today but he feels well and there is no sign of a wound infection. We will discharge with a Foley leg bag. Discussed with Dr. Ludwig LeanLaughlin on symptoms have a cystogram on Wednesday and follow-up in her office on Thursday.  Lattie Hawichard E Cooper, MD, FACS  12/19/2015

## 2015-12-22 ENCOUNTER — Telehealth: Payer: Self-pay

## 2015-12-22 NOTE — Telephone Encounter (Signed)
Patient called stating that he needs a work note. Patient was admitted into the hospital on 12/16/05 and discharged on 12/19/2015. I wrote the patient a work excuse from the day he was admitted into the hospital to the first day of his next post op appointment 12/17/2015-12/29/2015. Patient [place] [facility].

## 2015-12-24 ENCOUNTER — Telehealth: Payer: Self-pay

## 2015-12-24 ENCOUNTER — Ambulatory Visit
Admit: 2015-12-24 | Discharge: 2015-12-24 | Disposition: A | Discharge status: Home / Self Care | Payer: Self-pay | Attending: Surgery | Admitting: Surgery

## 2015-12-24 DIAGNOSIS — K4030 Unilateral inguinal hernia, with obstruction, without gangrene, not specified as recurrent: Secondary | ICD-10-CM | POA: Insufficient documentation

## 2015-12-24 MED ORDER — IOTHALAMATE MEGLUMINE 17.2 % UR SOLN
250.0000 mL | Freq: Once | URETHRAL | Status: AC | PRN
Start: 2015-12-24 — End: 2015-12-24
  Administered 2015-12-24: 200 mL via URETHRAL

## 2015-12-24 NOTE — Telephone Encounter (Signed)
Misty [facility]Lisa at [facility] called regarding Elijah Fitzpatrick. He was in the office wanting to get his catheter removed. I asked her if she wanted me to call her back and she said to call the patient back in regards to what he should do. Patient has an appointment on 12/29/2015.

## 2015-12-25 ENCOUNTER — Ambulatory Visit (INDEPENDENT_AMBULATORY_CARE_PROVIDER_SITE_OTHER): Payer: Self-pay | Admitting: Surgery

## 2015-12-25 ENCOUNTER — Encounter: Payer: Self-pay | Admitting: Surgery

## 2015-12-25 VITALS — BP 135/78 | HR 67 | Temp 98.3°F | Ht 73.0 in | Wt 216.0 lb

## 2015-12-25 DIAGNOSIS — K4091 Unilateral inguinal hernia, without obstruction or gangrene, recurrent: Secondary | ICD-10-CM

## 2015-12-25 NOTE — Telephone Encounter (Signed)
Call made to patient at this time. Referral to be cancelled and patient will follow-up in office today at 1330 for catheter removal.

## 2015-12-25 NOTE — Progress Notes (Signed)
55 yr old male who had an incarcerated right inguinal hernia with a large amount of bowel and bladder in the wound, bladder injury was sustained during the surgery and patient had a bladder repair and has a Foley catheter in place. Patient has cystogram yesterday that showed no extravasation of the bladder. Patient states he's been having some soreness pain and swelling in the area as well as some bruising and soreness to his testicles. Patient has been up and walking around about 30 minutes in the morning and 30 minutes in the afternoon. Patient states that he's been eating well and having good bowel movements without any difficulty.  Filed Vitals:   12/25/15 1321  BP: 135/78  Pulse: 67  Temp: 98.3 F (36.8 C)   PE:  Gen: NAD Right groin: incision c/d/i, with induration and swelling beneath area down through to scrotum and penis: some slight ecchymosis on the scrotum but otherwise no skin changes, erythema or drainage.   A/P:  Patient is healing well after a large right inguinal hernia repair. The cystogram was negative for any extravasation and so Foley catheter is being removed today. Patient is to let us know if he cannot be within 12 hours. Otherwise patient instructed to elevate his testicles with the [place] sitting down or lying down to help decrease the swelling in that area. He is also instructed to begin ice packs over the wound for 20 minutes at least 3-4 times a day. Patient remain out of work for this week. We will give him a work excuse note to go back Monday afternoons with light duty no lifting over 15-20 pounds for the next 5 weeks. Will have the patient return in 3 weeks to ensure that he's continuing to heal and do well. Patient was also instructed as if he should have any questions or concerns to call the office.

## 2015-12-25 NOTE — Patient Instructions (Signed)
Place Ice pack to the area 4-5 times daily with a barrier in between your skin and the ice pack.  While resting, Roll a towel and place under testicles in order to elevate them and decrease swelling.   Please follow-up in office as scheduled below in 3 weeks.  Please see your return to work note provided.

## 2015-12-26 ENCOUNTER — Encounter: Payer: Self-pay | Admitting: Surgery

## 2015-12-29 ENCOUNTER — Emergency Department: Payer: Self-pay

## 2015-12-29 ENCOUNTER — Encounter: Payer: Self-pay | Admitting: Emergency Medicine

## 2015-12-29 ENCOUNTER — Ambulatory Visit: Payer: Self-pay | Admitting: General Surgery

## 2015-12-29 ENCOUNTER — Observation Stay
Admission: EM | Admit: 2015-12-29 | Discharge: 2016-01-01 | Disposition: A | Discharge status: Home / Self Care | Payer: Self-pay | Attending: Surgery | Admitting: Surgery

## 2015-12-29 ENCOUNTER — Telehealth: Payer: Self-pay

## 2015-12-29 DIAGNOSIS — Z7982 Long term (current) use of aspirin: Secondary | ICD-10-CM | POA: Insufficient documentation

## 2015-12-29 DIAGNOSIS — Z4659 Encounter for fitting and adjustment of other gastrointestinal appliance and device: Secondary | ICD-10-CM | POA: Insufficient documentation

## 2015-12-29 DIAGNOSIS — I252 Old myocardial infarction: Secondary | ICD-10-CM | POA: Insufficient documentation

## 2015-12-29 DIAGNOSIS — G8918 Other acute postprocedural pain: Secondary | ICD-10-CM | POA: Insufficient documentation

## 2015-12-29 DIAGNOSIS — K567 Ileus, unspecified: Principal | ICD-10-CM | POA: Insufficient documentation

## 2015-12-29 DIAGNOSIS — Z9889 Other specified postprocedural states: Secondary | ICD-10-CM | POA: Insufficient documentation

## 2015-12-29 DIAGNOSIS — R197 Diarrhea, unspecified: Secondary | ICD-10-CM

## 2015-12-29 DIAGNOSIS — F17200 Nicotine dependence, unspecified, uncomplicated: Secondary | ICD-10-CM | POA: Insufficient documentation

## 2015-12-29 DIAGNOSIS — R112 Nausea with vomiting, unspecified: Secondary | ICD-10-CM | POA: Insufficient documentation

## 2015-12-29 DIAGNOSIS — R1084 Generalized abdominal pain: Secondary | ICD-10-CM

## 2015-12-29 DIAGNOSIS — K219 Gastro-esophageal reflux disease without esophagitis: Secondary | ICD-10-CM | POA: Insufficient documentation

## 2015-12-29 DIAGNOSIS — Z8249 Family history of ischemic heart disease and other diseases of the circulatory system: Secondary | ICD-10-CM | POA: Insufficient documentation

## 2015-12-29 DIAGNOSIS — I2510 Atherosclerotic heart disease of native coronary artery without angina pectoris: Secondary | ICD-10-CM | POA: Insufficient documentation

## 2015-12-29 DIAGNOSIS — K56609 Unspecified intestinal obstruction, unspecified as to partial versus complete obstruction: Secondary | ICD-10-CM | POA: Diagnosis present

## 2015-12-29 DIAGNOSIS — I10 Essential (primary) hypertension: Secondary | ICD-10-CM | POA: Insufficient documentation

## 2015-12-29 DIAGNOSIS — I700 Atherosclerosis of aorta: Secondary | ICD-10-CM | POA: Insufficient documentation

## 2015-12-29 LAB — LIPASE, BLOOD: Lipase: 21 U/L (ref 11–51)

## 2015-12-29 LAB — COMPREHENSIVE METABOLIC PANEL
ALBUMIN: 4 g/dL (ref 3.5–5.0)
ALK PHOS: 37 U/L — AB (ref 38–126)
ALT: 14 U/L — ABNORMAL LOW (ref 17–63)
ANION GAP: 9 (ref 5–15)
AST: 13 U/L — ABNORMAL LOW (ref 15–41)
BILIRUBIN TOTAL: 0.6 mg/dL (ref 0.3–1.2)
BUN: 11 mg/dL (ref 6–20)
CALCIUM: 9.2 mg/dL (ref 8.9–10.3)
CO2: 26 mmol/L (ref 22–32)
Chloride: 102 mmol/L (ref 101–111)
Creatinine, Ser: 0.75 mg/dL (ref 0.61–1.24)
GFR calc Af Amer: >60 mL/min (ref >60)
GLUCOSE: 106 mg/dL — AB (ref 65–99)
POTASSIUM: 4 mmol/L (ref 3.5–5.1)
Sodium: 137 mmol/L (ref 135–145)
TOTAL PROTEIN: 7.1 g/dL (ref 6.5–8.1)

## 2015-12-29 LAB — URINALYSIS COMPLETE WITH MICROSCOPIC (ARMC ONLY)
Bacteria, UA: NONE SEEN
Bilirubin Urine: NEGATIVE
GLUCOSE, UA: NEGATIVE mg/dL
LEUKOCYTES UA: NEGATIVE
NITRITE: NEGATIVE
Protein, ur: NEGATIVE mg/dL
SPECIFIC GRAVITY, URINE: 1.014 (ref 1.005–1.030)
Squamous Epithelial / LPF: NONE SEEN
pH: 6 (ref 5.0–8.0)

## 2015-12-29 LAB — CBC
HEMATOCRIT: 43.6 % (ref 40.0–52.0)
HEMOGLOBIN: 14.9 g/dL (ref 13.0–18.0)
MCH: 31.6 pg (ref 26.0–34.0)
MCHC: 34.3 g/dL (ref 32.0–36.0)
MCV: 92.2 fL (ref 80.0–100.0)
Platelets: 301 10*3/uL (ref 150–440)
RBC: 4.73 MIL/uL (ref 4.40–5.90)
RDW: 13.2 % (ref 11.5–14.5)
WBC: 12.8 10*3/uL — AB (ref 3.8–10.6)

## 2015-12-29 MED ORDER — METOPROLOL TARTRATE 25 MG PO TABS
25.0000 mg | ORAL_TABLET | Freq: Two times a day (BID) | ORAL | Status: DC
  Administered 2015-12-29 – 2016-01-01 (×6): 25 mg via ORAL
  Filled 2015-12-29 (×6): qty 1

## 2015-12-29 MED ORDER — PHENOL 1.4 % MT LIQD
1.0000 | OROMUCOSAL | Status: DC | PRN
  Filled 2015-12-29: qty 177

## 2015-12-29 MED ORDER — ONDANSETRON HCL 4 MG PO TABS: 4.0000 mg | ORAL_TABLET | Freq: Four times a day (QID) | ORAL | Status: DC | PRN

## 2015-12-29 MED ORDER — SODIUM CHLORIDE 0.9 % IV BOLUS (SEPSIS)
1000.0000 mL | Freq: Once | INTRAVENOUS | Status: AC
  Administered 2015-12-29: 1000 mL via INTRAVENOUS

## 2015-12-29 MED ORDER — ONDANSETRON 4 MG PO TBDP
4.0000 mg | ORAL_TABLET | Freq: Once | ORAL | Status: AC
  Administered 2015-12-29: 4 mg via ORAL
  Filled 2015-12-29: qty 1

## 2015-12-29 MED ORDER — IOPAMIDOL (ISOVUE-300) INJECTION 61%
100.0000 mL | Freq: Once | INTRAVENOUS | Status: AC | PRN
  Administered 2015-12-29: 100 mL via INTRAVENOUS

## 2015-12-29 MED ORDER — HEPARIN SODIUM (PORCINE) 5000 UNIT/ML IJ SOLN
5000.0000 [IU] | Freq: Three times a day (TID) | INTRAMUSCULAR | Status: DC
  Administered 2015-12-29 – 2016-01-01 (×9): 5000 [IU] via SUBCUTANEOUS
  Filled 2015-12-29 (×9): qty 1

## 2015-12-29 MED ORDER — DEXTROSE IN LACTATED RINGERS 5 % IV SOLN
INTRAVENOUS | Status: DC
  Administered 2015-12-29 – 2015-12-30 (×4): via INTRAVENOUS

## 2015-12-29 MED ORDER — ONDANSETRON HCL 4 MG/2ML IJ SOLN
4.0000 mg | Freq: Four times a day (QID) | INTRAMUSCULAR | Status: DC | PRN
Start: 2015-12-29 — End: 2016-01-01

## 2015-12-29 MED ORDER — DIATRIZOATE MEGLUMINE & SODIUM 66-10 % PO SOLN
15.0000 mL | ORAL | Status: DC
Start: 2015-12-29 — End: 2015-12-29
  Administered 2015-12-29: 15 mL via ORAL

## 2015-12-29 MED ORDER — OXYCODONE HCL 5 MG PO TABS: 5.0000 mg | ORAL_TABLET | ORAL | Status: DC | PRN

## 2015-12-29 MED ORDER — ASPIRIN EC 81 MG PO TBEC
81.0000 mg | DELAYED_RELEASE_TABLET | Freq: Every day | ORAL | Status: DC
  Administered 2015-12-30 – 2016-01-01 (×3): 81 mg via ORAL
  Filled 2015-12-29 (×3): qty 1

## 2015-12-29 MED ORDER — DIPHENHYDRAMINE HCL 50 MG/ML IJ SOLN: 12.5000 mg | Freq: Four times a day (QID) | INTRAMUSCULAR | Status: DC | PRN

## 2015-12-29 MED ORDER — HYDROMORPHONE HCL 1 MG/ML IJ SOLN
1.0000 mg | INTRAMUSCULAR | Status: DC | PRN
  Administered 2015-12-29 – 2015-12-30 (×3): 1 mg via INTRAVENOUS
  Filled 2015-12-29 (×3): qty 1

## 2015-12-29 MED ORDER — DIPHENHYDRAMINE HCL 50 MG/ML IJ SOLN
12.5000 mg | Freq: Once | INTRAMUSCULAR | Status: AC
  Administered 2015-12-29: 12.5 mg via INTRAVENOUS
  Filled 2015-12-29: qty 1

## 2015-12-29 MED ORDER — DIATRIZOATE MEGLUMINE & SODIUM 66-10 % PO SOLN: 30.0000 mL | Freq: Once | ORAL | Status: DC

## 2015-12-29 NOTE — H&P (Signed)
Surgical Consultation  12/29/2015  Elijah Fitzpatrick is an 55 y.o. male.   CC: Nausea vomiting and diarrhea  HPI: This patient several weeks out from a incarcerated right inguinal hernia repair by Dr. Heath Lark where he experienced a bladder injury that was treated with closure primarily and compression with a Foley catheter. Last week he had a cystogram which showed no extravasation and Foley catheter was removed.  He was doing well until/Saturday morning at which time he woke up to the stretch felt a pop and had acute onset of nausea vomiting and diarrhea he has had periodic nausea and vomiting and little diarrhea since then but his nausea vomiting has persisted he is also had some abdominal pain and came to the emergency room for evaluation.  I was asked see the patient for management consideration. Fevers or chills  Past Medical History  Diagnosis Date  . Blocked artery ([place])   . Coronary artery disease   . Hypertension   . Myocardial infarction ([place])     x3 in 2013    Past Surgical History  Procedure Laterality Date  . Hernia repair      Bilateral open in 1990s  . Inguinal hernia repair Right 12/18/2015    Procedure: HERNIA REPAIR INGUINAL ADULT;  Surgeon: Hubbard Robinson, MD;  Location: [facility] ORS;  Service: General;  Laterality: Right;  . Bladder repair  12/18/2015    Procedure: BLADDER REPAIR;  Surgeon: Hubbard Robinson, MD;  Location: [facility] ORS;  Service: General;;    Family History  Problem Relation Age of Onset  . Heart disease Father   . Cancer Maternal Uncle     Social History:  reports that he has been smoking.  He has quit using smokeless tobacco. He reports that he does not drink alcohol or use illicit drugs.  Allergies: No Known Allergies  Medications reviewed.   Review of Systems:   Review of Systems  Constitutional: Negative for fever and chills.  Gastrointestinal: Positive for nausea, vomiting, abdominal pain and diarrhea. Negative for heartburn.      Physical Exam:  BP 120/80 mmHg  Pulse 68  Temp(Src) 98.1 F (36.7 C) (Oral)  Resp 16  Ht 6' (1.829 m)  Wt 216 lb (97.977 kg)  BMI 29.29 kg/m2  SpO2 99%  Physical Exam  Constitutional: He is well-developed, well-nourished, and in no distress. No distress.  Comfortable-appearing male  HENT:  Head: Normocephalic and atraumatic.  Abdominal: Soft. He exhibits no distension. There is tenderness. There is no rebound and no guarding.  Right lower quadrant incision without erythema or drainage. No ecchymosis Abdomen is minimally tender diffusely without peritoneal signs  Genitourinary: Penis normal. No discharge found.  Skin: Skin is warm and dry. No rash noted. He is not diaphoretic. No erythema.  Vitals reviewed.     Results for orders placed or performed during the hospital encounter of 12/29/15 (from the past 48 hour(s))  Lipase, blood     Status: None   Collection Time: 12/29/15  5:45 PM  Result Value Ref Range   Lipase 21 11 - 51 U/L  Comprehensive metabolic panel     Status: Abnormal   Collection Time: 12/29/15  5:45 PM  Result Value Ref Range   Sodium 137 135 - 145 mmol/L   Potassium 4.0 3.5 - 5.1 mmol/L   Chloride 102 101 - 111 mmol/L   CO2 26 22 - 32 mmol/L   Glucose, Bld 106 (H) 65 - 99 mg/dL   BUN 11 6 -  20 mg/dL   Creatinine, Ser 0.75 0.61 - 1.24 mg/dL   Calcium 9.2 8.9 - 10.3 mg/dL   Total Protein 7.1 6.5 - 8.1 g/dL   Albumin 4.0 3.5 - 5.0 g/dL   AST 13 (L) 15 - 41 U/L   ALT 14 (L) 17 - 63 U/L   Alkaline Phosphatase 37 (L) 38 - 126 U/L   Total Bilirubin 0.6 0.3 - 1.2 mg/dL   GFR calc non Af Amer >60 >60 mL/min   GFR calc Af Amer >60 >60 mL/min    Comment: (NOTE) The eGFR has been calculated using the CKD EPI equation. This calculation has not been validated in all clinical situations. eGFR's persistently <60 mL/min signify possible Chronic Kidney Disease.    Anion gap 9 5 - 15  CBC     Status: Abnormal   Collection Time: 12/29/15  5:45 PM   Result Value Ref Range   WBC 12.8 (H) 3.8 - 10.6 K/uL   RBC 4.73 4.40 - 5.90 MIL/uL   Hemoglobin 14.9 13.0 - 18.0 g/dL   HCT 43.6 40.0 - 52.0 %   MCV 92.2 80.0 - 100.0 fL   MCH 31.6 26.0 - 34.0 pg   MCHC 34.3 32.0 - 36.0 g/dL   RDW 13.2 11.5 - 14.5 %   Platelets 301 150 - 440 K/uL   No results found.  Assessment/Plan:  Discussed with Dr. Baker Janus and I suggest that patient have a CT scan to rule out potential causes for his nausea vomiting diarrhea and abdominal pain in the postoperative period he does not seem to be clinically obstructed by subjective or objective findings but the etiology of this is so unclear to me that I believe the CT scan is necessary in conjunction with his abnormal white blood cell count there could be something potentially serious in the postoperative period I will discuss results with Dr. Baker Janus once these results are available  Florene Glen, MD, FACS

## 2015-12-29 NOTE — ED Notes (Signed)
Patient was encouraged to give urine specimen when he gets up to undress. Patient was given a specimen cup and cleansing wipe. NS was placed on rolling pole and patient was given instructions for use. Patient indicated understanding of instructions by repeating the instructions.

## 2015-12-29 NOTE — ED Provider Notes (Signed)
[facility] Emergency Department Provider Note   ____________________________________________  Time seen: Approximately 5:58 PM  I have reviewed the triage vital signs and the nursing notes.   HISTORY  Chief Complaint Abdominal Pain    HPI VINAY ERTL is a 55 y.o. male with history of coronary artery disease, hypertension, postoperative day 11 status post right inguinal hernia repair complicated by bladder injury who presents for evaluation of 3 days of generalized abdominal cramping as well as nausea vomiting and diarrhea, gradual onset, constant, currently mild to moderate, no modifying factors. Patient reports that 3 days ago his symptoms were severe, he had severe abdominal cramping, recurrent nonbloody nonbilious emesis and recurrent diarrhea. Symptoms were improving yesterday. Today he continued to have intermittent vomiting. No fevers or chills. No diarrhea today. He had a small bowel movement today and continues to pass flatus. No pain or burning with urination. He reports that his incision in the right groin is healing well. No chest pain, difficulty breathing, or pain or burning with urination. He is urinating well.   Past Medical History  Diagnosis Date  . Blocked artery (HCC)   . Coronary artery disease   . Hypertension   . Myocardial infarction (HCC)     x3 in 2013    Patient Active Problem List   Diagnosis Date Noted  . Intraoperative bladder injury     Past Surgical History  Procedure Laterality Date  . Hernia repair      Bilateral open in 1990s  . Inguinal hernia repair Right 12/18/2015    Procedure: HERNIA REPAIR INGUINAL ADULT;  Surgeon: Gladis Riffle, MD;  Location: [facility] ORS;  Service: General;  Laterality: Right;  . Bladder repair  12/18/2015    Procedure: BLADDER REPAIR;  Surgeon: Gladis Riffle, MD;  Location: [facility] ORS;  Service: General;;    Current Outpatient Rx  Name  Route  Sig  Dispense  Refill  . aspirin EC  81 MG tablet   Oral   Take 81 mg by mouth daily.         . metoprolol tartrate (LOPRESSOR) 25 MG tablet   Oral   Take 1 tablet (25 mg total) by mouth 2 (two) times daily.   60 tablet   1   . oxyCODONE (OXY IR/ROXICODONE) 5 MG immediate release tablet   Oral   Take 1-2 tablets (5-10 mg total) by mouth every 4 (four) hours as needed for moderate pain. Patient not taking: Reported on 12/29/2015   30 tablet   0     Allergies Review of patient's allergies indicates no known allergies.  Family History  Problem Relation Age of Onset  . Heart disease Father   . Cancer Maternal Uncle     Social History Social History  Substance Use Topics  . Smoking status: Current Every Day Smoker -- 1.00 packs/day for 40 years  . Smokeless tobacco: Former Neurosurgeon  . Alcohol Use: No    Review of Systems Constitutional: No fever/chills Eyes: No visual changes. ENT: No sore throat. Cardiovascular: Denies chest pain. Respiratory: Denies shortness of breath. Gastrointestinal: + abdominal pain.  + nausea, + vomiting.  + diarrhea.  No constipation. Genitourinary: Negative for dysuria. Musculoskeletal: Negative for back pain. Skin: Negative for rash. Neurological: Negative for headaches, focal weakness or numbness.  10-point ROS otherwise negative.  ____________________________________________   PHYSICAL EXAM:  VITAL SIGNS: ED Triage Vitals  Enc Vitals Group     BP 12/29/15 1744 143/86 mmHg  Pulse Rate 12/29/15 1744 77     Resp 12/29/15 1744 16     Temp 12/29/15 1744 98.1 F (36.7 C)     Temp Source 12/29/15 1744 Oral     SpO2 12/29/15 1744 98 %     Weight 12/29/15 1744 216 lb (97.977 kg)     Height 12/29/15 1744 6' (1.829 m)     Head Cir --      Peak Flow --      Pain Score 12/29/15 1744 8     Pain Loc --      Pain Edu? --      Excl. in GC? --     Constitutional: Alert and oriented. Well appearing and in no acute distress. Eyes: Conjunctivae are normal. PERRL.  EOMI. Head: Atraumatic. Nose: No congestion/rhinnorhea. Mouth/Throat: Mucous membranes are moist.  Oropharynx non-erythematous. Neck: No stridor. Supple without meningismus.  Cardiovascular: Normal rate, regular rhythm. Grossly normal heart sounds.  Good peripheral circulation. Respiratory: Normal respiratory effort.  No retractions. Lungs CTAB. Gastrointestinal: Soft and nontender. No distention. Normal bowel sounds. Right groin with a large horizontal incision healing well without evidence of purulent drainage, no erythema, there is some firm weakness/induration associated with the incision. No CVA tenderness. Genitourinary: Deferred Musculoskeletal: No lower extremity tenderness nor edema.  No joint effusions. Neurologic:  Normal speech and language. No gross focal neurologic deficits are appreciated. No gait instability. Skin:  Skin is warm, dry and intact. No rash noted. Psychiatric: Mood and affect are normal. Speech and behavior are normal.  ____________________________________________   LABS (all labs ordered are listed, but only abnormal results are displayed)  Labs Reviewed  COMPREHENSIVE METABOLIC PANEL - Abnormal; Notable for the following:    Glucose, Bld 106 (*)    AST 13 (*)    ALT 14 (*)    Alkaline Phosphatase 37 (*)    All other components within normal limits  CBC - Abnormal; Notable for the following:    WBC 12.8 (*)    All other components within normal limits  URINALYSIS COMPLETEWITH MICROSCOPIC ([facility] ONLY) - Abnormal; Notable for the following:    Color, Urine YELLOW (*)    APPearance HAZY (*)    Ketones, ur TRACE (*)    Hgb urine dipstick 1+ (*)    All other components within normal limits  LIPASE, BLOOD   ____________________________________________  EKG  none ____________________________________________  RADIOLOGY  CT abdomen and pelvis - pending ____________________________________________   PROCEDURES  Procedure(s) performed:  None  Procedures  Critical Care performed: No  ____________________________________________   INITIAL IMPRESSION / ASSESSMENT AND PLAN / ED COURSE  Pertinent labs & imaging results that were available during my care of the patient were reviewed by me and considered in my medical decision making (see chart for details).  JILES GOYA is a 55 y.o. male with history of coronary artery disease, hypertension, postoperative day 11 status post right inguinal hernia repair complicated by bladder injury who presents for evaluation of 3 days of generalized abdominal cramping as well as nausea vomiting and diarrhea. On exam, he is well-appearing and in no acute distress. Vital signs stable, he is afebrile. Abdomen is soft, nontender, nondistended. He does have a healing incision in the right groin with some firmness which could represent a small seroma but no evidence of cellulitis or fluctuance to suggest abscess. I suspect his symptoms today may be secondary to a viral syndrome and less likely to be related to some sort of postoperative complication  given that he is doing quite well from a surgical standpoint up until 3 days ago however I discussed the case with Dr. Excell Seltzerooper, on-call for general surgery, and he will evaluate the patient. CBC shows mild leukocytosis which is down trending from prior. Unremarkable CMP and lipase. Awaiting urinalysis. We'll give Zofran.  ----------------------------------------- 8:37 PM on 12/29/2015 ----------------------------------------- Dr. Excell Seltzerooper has evaluated the patient, recommends a CT of the abdomen and pelvis which is pending. Urinalysis is also pending at this time. Care transferred to Dr. Fanny BienQuale pending imaging and final disposition.  ____________________________________________   FINAL CLINICAL IMPRESSION(S) / ED DIAGNOSES  Final diagnoses:  Generalized abdominal pain  Nausea vomiting and diarrhea      NEW MEDICATIONS STARTED DURING THIS  VISIT:  New Prescriptions   No medications on file     Note:  This document was prepared using Dragon voice recognition software and may include unintentional dictation errors.    Gayla DossEryka A Bindu Docter, MD 12/29/15 2038

## 2015-12-29 NOTE — ED Notes (Addendum)
Pt here with N/V, lower abdominal pain and diarrhea since Saturday. Pt with recent hernia surgery on 7/5.

## 2015-12-29 NOTE — Telephone Encounter (Signed)
/  Patient saw Dr. Orvis BrillLoflin on 12/26/2015 for his inguinal hernia follow up. Everything in the notes states that he was doing better. On Saturday he states that he woke up in the morning, stretched, and then he threw up and had water diarrhea. He had a lot of acid in his stomach, it's tender to the touch. He only ate an egg sandwich on Saturday and sausage, eggs, and pudding on Sunday. I spoke with a nurse and she said it has nothing to do with the hernia, that it may just be a flu. Patient agrees that it may have just been something he ate on Friday that gave him food poison. I advised the patient to go see his pcp or an urgent care if the problems continue to occur.

## 2015-12-29 NOTE — ED Provider Notes (Signed)
IMPRESSION: 1. Findings consistent with small-bowel obstruction with transition point in the mid right abdomen, possible due to adhesions. Transition point is not in the vicinity of the right inguinal hernia repair, there is no evidence of recurrent hernia. 2. Postsurgical change in the right inguinal region with ill-defined fluid collection in the subcutaneous tissues tracking into the right inguinal canal. This may be a postsurgical seroma, no specific imaging findings to suggest abscess, however infected fluid is difficult to exclude on imaging findings alone. Small amount of free fluid in the pelvis without well-defined intra-abdominal/pelvic abscess. 3. Wall thickening and contour deformity right aspect of the urinary bladder, likely site of bladder injury and repair. No contrast extravasation on delayed phase imaging to suggest bladder leak. Mild residual inflammation.   Skin his with Dr. Excell Seltzerooper, he is admitting the patient. He did request to have an nasogastric tube placed. I discussed and updated the patient, he is agreeable with the plan to be admitted. The patient does report he developed some mild itching after IV contrast, suspect for possible mild allergy. I will treat him with a small dose of Benadryl as he reports itching is improving. No trouble breathing, no wheezing, awake alert no evidence of rash or edema.  Sharyn CreamerMark Quale, MD 12/29/15 2150

## 2015-12-29 NOTE — Progress Notes (Signed)
CT scan is personally reviewed. Report is pending. There is a seroma and intense inflammatory process in the right lower quadrant and right hernia area but no sign of recurrent hernia. There is however a low-grade bowel obstruction with fairly dilated proximal loops and a contrast-filled stomach. I spoke to the emergency room physician about the patient being admitted to the hospital and I believe he would warrant a nasogastric tube. I will see him later this evening after I finished in the operating room with another patient and discuss results.

## 2015-12-29 NOTE — Progress Notes (Signed)
Patient seen on the floor after admission. Nasogastric is in place about 150 cc is been removed total. He states he feels better now. Vital signs are reviewed Abdomen is nondistended minimally diffusely tender without peritoneal signs essentially unchanged exam. No erythema and right groin.  CT scan of been personally reviewed as in previous note. I reviewed with the patient the findings and the rationale for a nasogastric tube and observation at this point. He was understanding and in agreement with this plan.

## 2015-12-30 ENCOUNTER — Observation Stay: Payer: Self-pay

## 2015-12-30 DIAGNOSIS — K5669 Other intestinal obstruction: Secondary | ICD-10-CM

## 2015-12-30 LAB — CBC
HCT: 41.9 % (ref 40.0–52.0)
Hemoglobin: 14.6 g/dL (ref 13.0–18.0)
MCH: 31.9 pg (ref 26.0–34.0)
MCHC: 34.7 g/dL (ref 32.0–36.0)
MCV: 92 fL (ref 80.0–100.0)
PLATELETS: 276 10*3/uL (ref 150–440)
RBC: 4.56 MIL/uL (ref 4.40–5.90)
RDW: 13 % (ref 11.5–14.5)
WBC: 10.5 10*3/uL (ref 3.8–10.6)

## 2015-12-30 LAB — BASIC METABOLIC PANEL
Anion gap: 4 — ABNORMAL LOW (ref 5–15)
BUN: 9 mg/dL (ref 6–20)
CALCIUM: 8.8 mg/dL — AB (ref 8.9–10.3)
CO2: 32 mmol/L (ref 22–32)
CREATININE: 0.77 mg/dL (ref 0.61–1.24)
Chloride: 103 mmol/L (ref 101–111)
GFR calc non Af Amer: >60 mL/min (ref >60)
GLUCOSE: 127 mg/dL — AB (ref 65–99)
Potassium: 4 mmol/L (ref 3.5–5.1)
Sodium: 139 mmol/L (ref 135–145)

## 2015-12-30 MED ORDER — ALUM & MAG HYDROXIDE-SIMETH 200-200-20 MG/5ML PO SUSP
30.0000 mL | ORAL | Status: DC | PRN
  Administered 2015-12-30 – 2016-01-01 (×2): 30 mL via ORAL
  Filled 2015-12-30 (×2): qty 30

## 2015-12-30 MED ORDER — ACETAMINOPHEN 325 MG PO TABS
650.0000 mg | ORAL_TABLET | ORAL | Status: DC | PRN
  Administered 2015-12-30: 650 mg via ORAL
  Filled 2015-12-30: qty 2

## 2015-12-30 NOTE — Progress Notes (Signed)
Per Dr. Everlene FarrierPabon okay to place order for Acetaminophen 650mg  q 4 hours. Also okay to place pt on clear liquids for supper.

## 2015-12-30 NOTE — Progress Notes (Signed)
CSW received a consult for medication assistance. CSW informed RNCM. CSW is signing off. There are no CSW needs at this time but CSW is available if a need were to arise.  Woodroe Modehristina Ilyas Lipsitz, MSW, LCSW-A, LCAS-A Clinical Social Worker [phone]

## 2015-12-30 NOTE — Progress Notes (Signed)
CC: ileus Subjective: Feeling better, some mild intermittent pain,  Wbc better   Objective: Vital signs in last 24 hours: Temp:  [98 F (36.7 C)-98.1 F (36.7 C)] 98.1 F (36.7 C) (07/18 0627) Pulse Rate:  [57-78] 58 (07/18 0941) Resp:  [16-20] 20 (07/18 0627) BP: (120-154)/(72-100) 154/88 mmHg (07/18 0941) SpO2:  [97 %-99 %] 97 % (07/18 0627) Weight:  [91.808 kg (202 lb 6.4 oz)-97.977 kg (216 lb)] 91.808 kg (202 lb 6.4 oz) (07/17 2300)    Intake/Output from previous day: 07/17 0701 - 07/18 0700 In: 809 [I.V.:809] Out: 725 [Urine:525; Emesis/NG output:100] Intake/Output this shift: Total I/O In: 318 [I.V.:318] Out: 0   Physical exam: NAD Abd: soft, ND, NT, no peritonitis Inguinal incision healing well, no infection Ext: well perfused, no edema  Lab Results: CBC   Recent Labs  12/29/15 1745 12/30/15 0524  WBC 12.8* 10.5  HGB 14.9 14.6  HCT 43.6 41.9  PLT 301 276   BMET  Recent Labs  12/29/15 1745 12/30/15 0524  NA 137 139  K 4.0 4.0  CL 102 103  CO2 26 32  GLUCOSE 106* 127*  BUN 11 9  CREATININE 0.75 0.77  CALCIUM 9.2 8.8*   PT/INR No results for input(s): LABPROT, INR in the last 72 hours. ABG No results for input(s): PHART, HCO3 in the last 72 hours.  Invalid input(s): PCO2, PO2  Studies/Results: Ct Abdomen Pelvis W Contrast  12/29/2015  CLINICAL DATA:  Nausea, vomiting, abdominal pain and diarrhea for 2 days. Recent right inguinal hernia surgery 2 weeks prior with bladder injury treated by primary closure. EXAM: CT ABDOMEN AND PELVIS WITH CONTRAST TECHNIQUE: Multidetector CT imaging of the abdomen and pelvis was performed using the standard protocol following bolus administration of intravenous contrast. CONTRAST:  100mL ISOVUE-300 IOPAMIDOL (ISOVUE-300) INJECTION 61% COMPARISON:  Cystogram 12/24/2015 reviewed FINDINGS: Lower chest: Minimal left basilar atelectasis or scarring. No pleural effusion. Heart size is normal. Liver: Tiny scattered sub  cm hypodensities throughout both lobes of the liver, too small to characterize, may be small cysts, hemangiomas or hamartomas. Hepatobiliary: Gallbladder physiologically distended, no calcified stone. No biliary dilatation. Pancreas: No ductal dilatation or inflammation. Spleen: Normal. Adrenal glands: No nodule. Kidneys: Symmetric renal enhancement and excretion. No hydronephrosis. There is a 2.7 cm cyst in the lower left kidney. Stomach/Bowel: The stomach is distended with ingested contrast. Dilated fluid and contrast filled proximal small bowel loops with fecalization of small bowel contents in the right abdomen. There is a transition point in the anterior right mid abdomen axial image 47, coronal reformat image 31, sagittal reformat image 83. Small amount of mesenteric edema and free fluid in the right lower quadrant. The more distal small bowel loops are decompressed. Right lower quadrant small bowel loops abutting the right inguinal hernia repair are decompressed. No evidence of recurrent hernia. Small volume of stool throughout the colon without colonic wall thickening. The appendix is not visualized. Vascular/Lymphatic: No retroperitoneal adenopathy. Abdominal aorta is normal in caliber. Moderate atherosclerosis of the abdominal aorta, no aneurysm. Reproductive: Central prostatic calcification. Bladder: Contour deformity and wall thickening about the right lateral aspect, likely site of bladder repair. Mild perivesicular soft tissue stranding. No evidence of contrast extravasation on delayed phase imaging. No intravesicular air. Other: Postsurgical change in the right inguinal region with ill-defined fluid tracking from the subcutaneous tissues into the inguinal canal. Fluid is serpiginous in shape measuring approximately 8.4 x 2.6 x 7.7 cm. There is no internal air, definite thick wall or peripheral  enhancement. Adjacent subcutaneous edema in the right groin. There is small amount of simple free fluid  dependently in the pelvis without organized fluid collection. No free intra-abdominal air. Musculoskeletal: There are no acute or suspicious osseous abnormalities. 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 Additional degenerative change in the lumbar spine at other levels. IMPRESSION: 1. Findings consistent with small-bowel obstruction with transition point in the mid right abdomen, possible due to adhesions. Transition point is not in the vicinity of the right inguinal hernia repair, there is no evidence of recurrent hernia. 2. Postsurgical change in the right inguinal region with ill-defined fluid collection in the subcutaneous tissues tracking into the right inguinal canal. This may be a postsurgical seroma, no specific imaging findings to suggest abscess, however infected fluid is difficult to exclude on imaging findings alone. Small amount of free fluid in the pelvis without well-defined intra-abdominal/pelvic abscess. 3. Wall thickening and contour deformity right aspect of the urinary bladder, likely site of bladder injury and repair. No contrast extravasation on delayed phase imaging to suggest bladder leak. Mild residual inflammation. Electronically Signed   By: Rubye Oaks M.D.   On: 12/29/2015 21:30   Acute Abdominal Series  12/30/2015  CLINICAL DATA:  55 y/o  M; follow-up of small bowel obstruction. EXAM: DG ABDOMEN ACUTE W/ 1V CHEST COMPARISON:  CT of abdomen and pelvis dated 12/29/2015. FINDINGS: No consolidation, pneumothorax, or pleural effusion. Normal cardiomediastinal silhouette. Enteric tube tip is in the proximal stomach, proximal side hole is at the gastroesophageal junction. No free air identified. Persistent dilated loops of small bowel predominantly in the left upper quadrant and fluid levels may represent small-bowel obstruction. Contrast retained within the colon and bladder. No acute bony abnormality is identified. IMPRESSION: No significant interval  change in dilated loops of small bowel. No free air is identified Electronically Signed   By: Mitzi Hansen M.D.   On: 12/30/2015 08:21    Anti-infectives: Anti-infectives    None      Assessment/Plan: Ileus slowly improving kub shows contrast all the way to the colon so clearly it is not a complete obstruction No surgical intervention required at this time Continue IV hydration NGT clamping trial Continue NPO for now Sterling Big, MD, [facility]  12/30/2015

## 2015-12-31 NOTE — Discharge Summary (Signed)
Physician Discharge Summary  Patient ID: Elijah Fitzpatrick MRN: [id number] DOB/AGE: [birth year] 55 y.o.  Admit date: 12/17/15 Discharge date: 12/19/15  Discharge Diagnoses:  Incarcerated right inguinal hernia Bladder injury, intraoperative  Procedures:Repair of incarcerated right inguinal hernia Repair of inadvertent intra-operative bladder injury  Hospital Course: This a patient who presented to the hospital with a incarcerated long-standing right inguinal hernia Dr. Ludwig LeanLaughlin took the patient to the operating room where repair was performed with mesh however during the procedure there was an inadvertent bladder injury. This bladder injury was repaired in layers and a Foley catheter was left in place.  Postoperatively a Foley leg bag was ordered and the patient was discharged in stable condition with a Foley in place with a outpatient cystogram scheduled for Wednesday and then follow-up with Dr. Ludwig LeanLaughlin the next day in the office for likely Foley catheter removal.  Patient is instructed to return to the office should he have any difficulty or problems and is discharged on oral antibiotics and oral analgesics on a regular diet with instructions to shower.  Consults: None  Disposition: 01-Home or Self Care     Medication List    ASK your doctor about these medications        aspirin EC 81 MG tablet  Take 81 mg by mouth daily.     metoprolol tartrate 25 MG tablet  Commonly known as:  LOPRESSOR  Take 1 tablet (25 mg total) by mouth 2 (two) times daily.     oxyCODONE 5 MG immediate release tablet  Commonly known as:  Oxy IR/ROXICODONE  Take 1-2 tablets (5-10 mg total) by mouth every 4 (four) hours as needed for moderate pain.         Lattie Hawichard E Saran Laviolette, MD, FACS

## 2015-12-31 NOTE — Progress Notes (Signed)
CC: ileus Subjective: Doing better, NGT is out, sips of clears, passing gas having BM No Emesis WBC improved Good UO  Objective: Vital signs in last 24 hours: Temp:  [97.9 F (36.6 C)-98.7 F (37.1 C)] 98.1 F (36.7 C) (07/19 0525) Pulse Rate:  [58-70] 65 (07/19 0525) Resp:  [16-20] 16 (07/19 0525) BP: (128-154)/(80-88) 128/80 mmHg (07/19 0525) SpO2:  [97 %-100 %] 97 % (07/19 0525) Last BM Date: 12/31/15  Intake/Output from previous day: 07/18 0701 - 07/19 0700 In: 3867 [P.O.:660; I.V.:3177; NG/GT:30] Out: 3250 [Urine:3200; Emesis/NG output:50] Intake/Output this shift: Total I/O In: 100 [P.O.:100] Out: 200 [Urine:200]  Physical exam: NAD , awake alert Abd: soft, NT, no peritonitis, inguinal incisions healing well, no infection Ext: well perfused, no edema   Lab Results: CBC   Recent Labs  12/29/15 1745 12/30/15 0524  WBC 12.8* 10.5  HGB 14.9 14.6  HCT 43.6 41.9  PLT 301 276   BMET  Recent Labs  12/29/15 1745 12/30/15 0524  NA 137 139  K 4.0 4.0  CL 102 103  CO2 26 32  GLUCOSE 106* 127*  BUN 11 9  CREATININE 0.75 0.77  CALCIUM 9.2 8.8*   PT/INR No results for input(s): LABPROT, INR in the last 72 hours. ABG No results for input(s): PHART, HCO3 in the last 72 hours.  Invalid input(s): PCO2, PO2  Studies/Results: Ct Abdomen Pelvis W Contrast  12/29/2015  CLINICAL DATA:  Nausea, vomiting, abdominal pain and diarrhea for 2 days. Recent right inguinal hernia surgery 2 weeks prior with bladder injury treated by primary closure. EXAM: CT ABDOMEN AND PELVIS WITH CONTRAST TECHNIQUE: Multidetector CT imaging of the abdomen and pelvis was performed using the standard protocol following bolus administration of intravenous contrast. CONTRAST:  100mL ISOVUE-300 IOPAMIDOL (ISOVUE-300) INJECTION 61% COMPARISON:  Cystogram 12/24/2015 reviewed FINDINGS: Lower chest: Minimal left basilar atelectasis or scarring. No pleural effusion. Heart size is normal. Liver: Tiny  scattered sub cm hypodensities throughout both lobes of the liver, too small to characterize, may be small cysts, hemangiomas or hamartomas. Hepatobiliary: Gallbladder physiologically distended, no calcified stone. No biliary dilatation. Pancreas: No ductal dilatation or inflammation. Spleen: Normal. Adrenal glands: No nodule. Kidneys: Symmetric renal enhancement and excretion. No hydronephrosis. There is a 2.7 cm cyst in the lower left kidney. Stomach/Bowel: The stomach is distended with ingested contrast. Dilated fluid and contrast filled proximal small bowel loops with fecalization of small bowel contents in the right abdomen. There is a transition point in the anterior right mid abdomen axial image 47, coronal reformat image 31, sagittal reformat image 83. Small amount of mesenteric edema and free fluid in the right lower quadrant. The more distal small bowel loops are decompressed. Right lower quadrant small bowel loops abutting the right inguinal hernia repair are decompressed. No evidence of recurrent hernia. Small volume of stool throughout the colon without colonic wall thickening. The appendix is not visualized. Vascular/Lymphatic: No retroperitoneal adenopathy. Abdominal aorta is normal in caliber. Moderate atherosclerosis of the abdominal aorta, no aneurysm. Reproductive: Central prostatic calcification. Bladder: Contour deformity and wall thickening about the right lateral aspect, likely site of bladder repair. Mild perivesicular soft tissue stranding. No evidence of contrast extravasation on delayed phase imaging. No intravesicular air. Other: Postsurgical change in the right inguinal region with ill-defined fluid tracking from the subcutaneous tissues into the inguinal canal. Fluid is serpiginous in shape measuring approximately 8.4 x 2.6 x 7.7 cm. There is no internal air, definite thick wall or peripheral enhancement. Adjacent subcutaneous edema  in the right groin. There is small amount of simple  free fluid dependently in the pelvis without organized fluid collection. No free intra-abdominal air. Musculoskeletal: There are no acute or suspicious osseous abnormalities. 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 Additional degenerative change in the lumbar spine at other levels. IMPRESSION: 1. Findings consistent with small-bowel obstruction with transition point in the mid right abdomen, possible due to adhesions. Transition point is not in the vicinity of the right inguinal hernia repair, there is no evidence of recurrent hernia. 2. Postsurgical change in the right inguinal region with ill-defined fluid collection in the subcutaneous tissues tracking into the right inguinal canal. This may be a postsurgical seroma, no specific imaging findings to suggest abscess, however infected fluid is difficult to exclude on imaging findings alone. Small amount of free fluid in the pelvis without well-defined intra-abdominal/pelvic abscess. 3. Wall thickening and contour deformity right aspect of the urinary bladder, likely site of bladder injury and repair. No contrast extravasation on delayed phase imaging to suggest bladder leak. Mild residual inflammation. Electronically Signed   By: Rubye Oaks M.D.   On: 12/29/2015 21:30   Acute Abdominal Series  12/30/2015  CLINICAL DATA:  55 y/o  M; follow-up of small bowel obstruction. EXAM: DG ABDOMEN ACUTE W/ 1V CHEST COMPARISON:  CT of abdomen and pelvis dated 12/29/2015. FINDINGS: No consolidation, pneumothorax, or pleural effusion. Normal cardiomediastinal silhouette. Enteric tube tip is in the proximal stomach, proximal side hole is at the gastroesophageal junction. No free air identified. Persistent dilated loops of small bowel predominantly in the left upper quadrant and fluid levels may represent small-bowel obstruction. Contrast retained within the colon and bladder. No acute bony abnormality is identified. IMPRESSION: No  significant interval change in dilated loops of small bowel. No free air is identified Electronically Signed   By: Mitzi Hansen M.D.   On: 12/30/2015 08:21    Anti-infectives: Anti-infectives    None      Assessment/Plan: Ileus resolving, advance diet slowly No surgical intervention DC tomorrow  Sterling Big, MD, [facility]  12/31/2015

## 2015-12-31 NOTE — Care Management (Signed)
Patient admitted with post op ilues.    Patient self pay patient who was assessed by myself previous admission.  Patient is employeed. Patient states that he was able to obtain his medications from previous admission with coupons provided from [employer]  RNCM following for discharge medication needs.  If needed patient would like coupons printed from [employer] to [employer].   Patient was provided application to [facility] and Medication Management previous Admission.  Patient states that he still has the packets, but has not obtained all of the information that he needs in order to complete them.    RNCM following.

## 2016-01-01 ENCOUNTER — Observation Stay: Payer: MEDICAID

## 2016-01-01 DIAGNOSIS — R112 Nausea with vomiting, unspecified: Secondary | ICD-10-CM

## 2016-01-01 DIAGNOSIS — R197 Diarrhea, unspecified: Secondary | ICD-10-CM | POA: Insufficient documentation

## 2016-01-01 LAB — CBC
HCT: 40 % (ref 40.0–52.0)
Hemoglobin: 13.9 g/dL (ref 13.0–18.0)
MCH: 32.2 pg (ref 26.0–34.0)
MCHC: 34.8 g/dL (ref 32.0–36.0)
MCV: 92.4 fL (ref 80.0–100.0)
Platelets: 269 10*3/uL (ref 150–440)
RBC: 4.34 MIL/uL — ABNORMAL LOW (ref 4.40–5.90)
RDW: 13.2 % (ref 11.5–14.5)
WBC: 8.9 10*3/uL (ref 3.8–10.6)

## 2016-01-01 MED ORDER — SUCRALFATE 1 G PO TABS
1.0000 g | ORAL_TABLET | Freq: Two times a day (BID) | ORAL | Status: DC
  Administered 2016-01-01: 1 g via ORAL
  Filled 2016-01-01: qty 1

## 2016-01-01 MED ORDER — OMEPRAZOLE 40 MG PO CPDR: 40.0000 mg | DELAYED_RELEASE_CAPSULE | Freq: Every day | ORAL | Status: DC

## 2016-01-01 MED ORDER — ONDANSETRON HCL 4 MG PO TABS: 4.0000 mg | ORAL_TABLET | Freq: Four times a day (QID) | ORAL | Status: DC | PRN

## 2016-01-01 MED ORDER — SIMETHICONE 80 MG PO CHEW: 80.0000 mg | CHEWABLE_TABLET | Freq: Four times a day (QID) | ORAL | Status: DC

## 2016-01-01 MED ORDER — SUCRALFATE 1 G PO TABS: 1.0000 g | ORAL_TABLET | Freq: Two times a day (BID) | ORAL | Status: DC

## 2016-01-01 MED ORDER — FLEET ENEMA 7-19 GM/118ML RE ENEM
1.0000 | ENEMA | Freq: Once | RECTAL | Status: AC
  Administered 2016-01-01: 1 via RECTAL

## 2016-01-01 MED ORDER — ALUM & MAG HYDROXIDE-SIMETH 200-200-20 MG/5ML PO SUSP: 30.0000 mL | ORAL | Status: DC | PRN

## 2016-01-01 MED ORDER — SIMETHICONE 80 MG PO CHEW
80.0000 mg | CHEWABLE_TABLET | Freq: Four times a day (QID) | ORAL | Status: DC
  Administered 2016-01-01 (×2): 80 mg via ORAL
  Filled 2016-01-01 (×2): qty 1

## 2016-01-01 NOTE — Progress Notes (Signed)
Pt A and O x 4. VSS. Pt tolerating diet well. No complaints of pain or nausea. IV removed intact, prescriptions given. Pt voiced understanding of discharge instructions with no further questions. Pt discharged via wheelchair with nurse.   

## 2016-01-01 NOTE — Discharge Summary (Signed)
Patient ID: Elijah Fitzpatrick MRN: [id number] DOB/AGE: 10-07-1960 55 y.o.  Admit date: 12/29/2015 Discharge date: 01/01/2016   Discharge Diagnoses:  Active Problems:   SBO (small bowel obstruction) [facility](HCC)   Procedures:none  Hospital Course:  55 yo well know to our service s/p right IH repair 12/18/15 by Dr. Orvis Brillloflin for incarceration. Suffered bladder injury but had a good recovery. Admitted this time for nausea, vomiting and abdominal pain c/w ileus. NGT was placed, iv fluids started and we performed serial abdominal exams. His condition improved and his x ray showed contrast all the way to the colon. NGT removed and diet was advanced. He did have significant reflux and gas that was improved w simethicone and Carafate. At the time of DC he was ambulating, tolerating regular diet, having flatus and bm. On His physical exam he was in NAD, abdomen was soft, NT, incisions healing well w/o infection or recurrence. Condition at the time of DC is stable  Disposition: 01-Home or Self Care      Discharge Instructions    Call MD for:  difficulty breathing, headache or visual disturbances    Complete by:  As directed      Call MD for:  extreme fatigue    Complete by:  As directed      Call MD for:  hives    Complete by:  As directed      Call MD for:  persistant dizziness or light-headedness    Complete by:  As directed      Call MD for:  persistant nausea and vomiting    Complete by:  As directed      Call MD for:  redness, tenderness, or signs of infection (pain, swelling, redness, odor or green/yellow discharge around incision site)    Complete by:  As directed      Call MD for:  severe uncontrolled pain    Complete by:  As directed      Call MD for:  temperature >100.4    Complete by:  As directed      Diet - low sodium heart healthy    Complete by:  As directed      Discharge instructions    Complete by:  As directed   Please provide pt with work excuse, may return back to work on 731/17  ( light duty).     Increase activity slowly    Complete by:  As directed      Lifting restrictions    Complete by:  As directed   20 lbs x 4 more weeks            Medication List    TAKE these medications        alum & mag hydroxide-simeth 200-200-20 MG/5ML suspension  Commonly known as:  MAALOX/MYLANTA  Take 30 mLs by mouth every 4 (four) hours as needed for indigestion.     aspirin EC 81 MG tablet  Take 81 mg by mouth daily.     metoprolol tartrate 25 MG tablet  Commonly known as:  LOPRESSOR  Take 1 tablet (25 mg total) by mouth 2 (two) times daily.     omeprazole 40 MG capsule  Commonly known as:  PRILOSEC  Take 1 capsule (40 mg total) by mouth daily.     ondansetron 4 MG tablet  Commonly known as:  ZOFRAN  Take 1 tablet (4 mg total) by mouth every 6 (six) hours as needed for nausea.     oxyCODONE 5 MG immediate  release tablet  Commonly known as:  Oxy IR/ROXICODONE  Take 1-2 tablets (5-10 mg total) by mouth every 4 (four) hours as needed for moderate pain.     simethicone 80 MG chewable tablet  Commonly known as:  MYLICON  Chew 1 tablet (80 mg total) by mouth every 6 (six) hours.     sucralfate 1 g tablet  Commonly known as:  CARAFATE  Take 1 tablet (1 g total) by mouth 2 (two) times daily.       Follow-up Information    Follow up with [facility] In 2 weeks.   Contact information:   [address] [place] [address] [phone]       Sterling Big, MD FACS

## 2016-01-06 ENCOUNTER — Telehealth: Payer: Self-pay | Admitting: Surgery

## 2016-01-06 NOTE — Telephone Encounter (Signed)
LVM at this time for patient to return call.

## 2016-01-06 NOTE — Telephone Encounter (Signed)
Returned phone call to patient at this time. He states that he is having generalized abdominal cramping, worst at night. Having irregular bowel movements approximately every 2-4 days. Denies nausea and vomiting.  Informed patient that they should increase water intake and activity level as much as possible to help with bowel movement. Also educated that they may use Miralax over the counter x 2 doses, 6 hours apart, followed by Dulcolax x 2 doses, 6 hours apart until they have a successful bowel movement. If they are unsuccessful with oral medications, they will need to do 2 fleets enemas back to back. Asked patient to call back for further instructions if after taking all doses of these medications, they are still unsuccessful with a bowel movement.  Patient verbalizes understanding of this information.

## 2016-01-06 NOTE — Telephone Encounter (Signed)
Patient was in the bowel obstruction - prior to this he had hernia surgery. Patient is still not having regular bowel movements and has cramping off and on.  Also, he has been eating bland food, but he would like to know if he can start to more than this. Please call and advise.

## 2016-01-06 NOTE — Telephone Encounter (Signed)
Elijah Fitzpatrick called the [employer] office returning a phone call made to him this morning. The nurse in this location is gone for the day. I will route this telephone call to the nurse and a nurse will contact him as soon as they are able to.

## 2016-01-07 ENCOUNTER — Telehealth: Payer: Self-pay | Admitting: Surgery

## 2016-01-07 NOTE — Telephone Encounter (Signed)
Patient has tried the advice given yesterday - Miralax, Dulcolax and Fleet enemas and was unsuccessful. He would like to speak with you on next step. He states he thinks he needs to go to the ER so they can do an xray and check his bowels. Please call and advise.

## 2016-01-07 NOTE — Telephone Encounter (Signed)
Returned phone call to patient at this time. Patient states that he took 1 dose of Miralax and 1 tablet of Dulcolax and then his stomach began to cramp so he did not do any of the other orders. He had a few hard small balls of stool and slight relief but pain did not go away completely.  Explained to patient that if he cannot tolerate the other medications. He needs to pick up some Magnesium Citrate at the pharmacy and drink the entire bottle. I stressed the importance of increased water and mobility. Denies nausea. Had 1 episode of vomiting last night but this was after drinking his miralax. No nausea or vomiting since.   Reviewed signs of small bowel obstruction with patient and explained that if he began to vomiting continuously, breath begins to smell of fecal material, or he has severe abdominal pain; he needs to come to the Emergency Room immediately.

## 2016-01-14 ENCOUNTER — Ambulatory Visit (INDEPENDENT_AMBULATORY_CARE_PROVIDER_SITE_OTHER): Payer: Self-pay | Admitting: Surgery

## 2016-01-14 ENCOUNTER — Encounter: Payer: Self-pay | Admitting: Surgery

## 2016-01-14 VITALS — BP 130/80 | HR 66 | Temp 97.8°F | Ht 72.0 in | Wt 204.0 lb

## 2016-01-14 DIAGNOSIS — K4030 Unilateral inguinal hernia, with obstruction, without gangrene, not specified as recurrent: Secondary | ICD-10-CM

## 2016-01-14 DIAGNOSIS — K4091 Unilateral inguinal hernia, without obstruction or gangrene, recurrent: Secondary | ICD-10-CM

## 2016-01-14 NOTE — Patient Instructions (Signed)
Please give us a call if you have any questions or concerns. 

## 2016-01-15 ENCOUNTER — Encounter: Payer: Self-pay | Admitting: Surgery

## 2016-01-15 NOTE — Progress Notes (Signed)
55 yr old male who had a large right incarcerated inguinal hernia repair done on July 6 then had a subsequent readmission for ileus after that. Patient states that he has been doing well but he has been having constipation. The patient states he has not been drinking much water has been drinking coffee and tea. Patient states that the swelling that was in his right groin has much improved and that he is not having to take much for pain medicine at this time. Patient states that he did not have constipation issues prior to this however has been having some sense this time. The patient has been continuing to take his Prilosec 40 mg to help with some of his indigestion and GERD type symptoms. The patient started the MiraLAX and use some stool softeners however did not increase his water intake. The patient did drink a bottle of mag citrate which caused him to begin to have bowel movements. The patient states that since that time he does sometimes have hard stools but occasionally he'll get loose stools as well.  He denies any fever chills nausea or vomiting or abdominal pain.  Vitals:   01/14/16 1333  BP: 130/80  Pulse: 66  Temp: 97.8 F (36.6 C)   PE:  Gen: NAD Right groin: incision c/d/i, swelling markedly reduced, no erythema or drainage Abd: soft, non-tender, non-distended   A/P:  Patient is having some constipation issues after having an incarcerated right inguinal hernia repair. The patient understands the need for increasing his water intake and with using MiraLAX stool softeners and increasing this he has been having more normal bowel movements. The patient states that his appetite and energy are also returning. The patient would like to go back to work however he does not have a light duty option. The patient was given a work excuse note that stated that he could go back to light duty next Monday however would be on restriction of not lifting anything over 15-20 pounds for another 4 weeks. The  patient understands that this is for his protection so as not to tear any of the repair. The patient otherwise is doing that at her and is to follow-up when necessary

## 2017-03-03 ENCOUNTER — Emergency Department: Payer: Self-pay

## 2017-03-03 ENCOUNTER — Emergency Department
Admission: EM | Admit: 2017-03-03 | Discharge: 2017-03-03 | Disposition: A | Discharge status: Home / Self Care | Payer: Self-pay | Attending: Emergency Medicine | Admitting: Emergency Medicine

## 2017-03-03 ENCOUNTER — Encounter: Payer: Self-pay | Admitting: Emergency Medicine

## 2017-03-03 DIAGNOSIS — I10 Essential (primary) hypertension: Secondary | ICD-10-CM | POA: Insufficient documentation

## 2017-03-03 DIAGNOSIS — F17200 Nicotine dependence, unspecified, uncomplicated: Secondary | ICD-10-CM | POA: Insufficient documentation

## 2017-03-03 DIAGNOSIS — Z7982 Long term (current) use of aspirin: Secondary | ICD-10-CM | POA: Insufficient documentation

## 2017-03-03 DIAGNOSIS — I2510 Atherosclerotic heart disease of native coronary artery without angina pectoris: Secondary | ICD-10-CM | POA: Insufficient documentation

## 2017-03-03 DIAGNOSIS — R079 Chest pain, unspecified: Secondary | ICD-10-CM | POA: Insufficient documentation

## 2017-03-03 LAB — CBC
HCT: 44.4 % (ref 40.0–52.0)
Hemoglobin: 15.2 g/dL (ref 13.0–18.0)
MCH: 31.7 pg (ref 26.0–34.0)
MCHC: 34.2 g/dL (ref 32.0–36.0)
MCV: 92.6 fL (ref 80.0–100.0)
Platelets: 203 10*3/uL (ref 150–440)
RBC: 4.79 MIL/uL (ref 4.40–5.90)
RDW: 13.6 % (ref 11.5–14.5)
WBC: 11.8 10*3/uL — AB (ref 3.8–10.6)

## 2017-03-03 LAB — BASIC METABOLIC PANEL
Anion gap: 9 (ref 5–15)
BUN: 11 mg/dL (ref 6–20)
CHLORIDE: 104 mmol/L (ref 101–111)
CO2: 24 mmol/L (ref 22–32)
Calcium: 9.4 mg/dL (ref 8.9–10.3)
Creatinine, Ser: 0.75 mg/dL (ref 0.61–1.24)
GFR calc Af Amer: >60 mL/min (ref >60)
GFR calc non Af Amer: >60 mL/min (ref >60)
Glucose, Bld: 105 mg/dL — ABNORMAL HIGH (ref 65–99)
POTASSIUM: 3.8 mmol/L (ref 3.5–5.1)
SODIUM: 137 mmol/L (ref 135–145)

## 2017-03-03 LAB — TROPONIN I: Troponin I: <0.03 ng/mL (ref <0.03)

## 2017-03-03 NOTE — ED Triage Notes (Signed)
Pt to ED via POV with c/o aching chest pain x "months" per pt. Pt has hx of MI. VS stable, NAD noted at this time.

## 2017-03-03 NOTE — ED Provider Notes (Signed)
[facility] Emergency Department Provider Note   ____________________________________________    I have reviewed the triage vital signs and the nursing notes.   HISTORY  Chief Complaint Chest Pain     HPI Elijah Fitzpatrick is a 56 y.o. male Who presents with complaints of chest pain patient reports he has had chest pain over the last 3-4 months fairly frequently. He presents today because he was concerned because yesterday his pain was worse than usual. Currently he feels well and has no complaints. No shortness of breath. No pleurisy. No nausea or vomiting or diaphoresis. Has not taken his medications for 5 years because of insurance issues, reports that he had a cath in 2012 but no stents, he reports he "died on the table "and was transferred to [facility]. Medical records do confirm that he had an asystolic event and responded to atropine. Medical management was prescribed   Past Medical History:  Diagnosis Date  . Blocked artery   . Coronary artery disease   . Hypertension   . Myocardial infarction (HCC)    x3 in 2013    Patient Active Problem List   Diagnosis Date Noted  . Nausea vomiting and diarrhea   . SBO (small bowel obstruction) (HCC) 12/29/2015  . Intraoperative bladder injury     Past Surgical History:  Procedure Laterality Date  . BLADDER REPAIR  12/18/2015   Procedure: BLADDER REPAIR;  Surgeon: Gladis Riffle, MD;  Location: [facility] ORS;  Service: General;;  . HERNIA REPAIR     Bilateral open in 1990s  . INGUINAL HERNIA REPAIR Right 12/18/2015   Procedure: HERNIA REPAIR INGUINAL ADULT;  Surgeon: Gladis Riffle, MD;  Location: [facility] ORS;  Service: General;  Laterality: Right;    Prior to Admission medications   Medication Sig Start Date End Date Taking? Authorizing Provider  aspirin EC 81 MG tablet Take 81 mg by mouth daily.   Yes [provider]  alum & mag hydroxide-simeth (MAALOX/MYLANTA) 200-200-20 MG/5ML suspension Take  30 mLs by mouth every 4 (four) hours as needed for indigestion. Patient not taking: Reported on 03/03/2017 01/01/16   Sterling Big F, MD  metoprolol tartrate (LOPRESSOR) 25 MG tablet Take 1 tablet (25 mg total) by mouth 2 (two) times daily. Patient not taking: Reported on 03/03/2017 12/19/15   Lattie Haw, MD  omeprazole (PRILOSEC) 40 MG capsule Take 1 capsule (40 mg total) by mouth daily. Patient not taking: Reported on 03/03/2017 01/01/16   Sterling Big F, MD  ondansetron (ZOFRAN) 4 MG tablet Take 1 tablet (4 mg total) by mouth every 6 (six) hours as needed for nausea. Patient not taking: Reported on 03/03/2017 01/01/16   Leafy Ro, MD     Allergies Iodinated diagnostic agents  Family History  Problem Relation Age of Onset  . Heart disease Father   . Cancer Maternal Uncle     Social History Social History  Substance Use Topics  . Smoking status: Current Every Day Smoker    Packs/day: 1.00    Years: 40.00  . Smokeless tobacco: Former Neurosurgeon  . Alcohol use No    Review of Systems  Constitutional: No fever/chills Eyes: No visual changes.  ENT: No sore throat. Cardiovascular: as above Respiratory: Denies shortness of breath. Gastrointestinal: No abdominal pain.  No nausea, no vomiting.   Genitourinary: Negative for dysuria. Musculoskeletal: Negative for back pain. Skin: Negative for rash. Neurological: Negative for headaches   ____________________________________________   PHYSICAL EXAM:  VITAL  SIGNS: ED Triage Vitals  Enc Vitals Group     BP 03/03/17 1115 (!) 152/93     Pulse Rate 03/03/17 1113 73     Resp 03/03/17 1113 16     Temp 03/03/17 1113 98.1 F (36.7 C)     Temp Source 03/03/17 1113 Oral     SpO2 03/03/17 1113 99 %     Weight 03/03/17 1114 93 kg (205 lb)     Height 03/03/17 1114 1.829 m (6')     Head Circumference --      Peak Flow --      Pain Score 03/03/17 1112 2     Pain Loc --      Pain Edu? --      Excl. in GC? --     Constitutional:  Alert and oriented. No acute distress. Pleasant and interactive Eyes: Conjunctivae are normal.   Nose: No congestion/rhinnorhea. Mouth/Throat: Mucous membranes are moist.    Cardiovascular: Normal rate, regular rhythm. Grossly normal heart sounds.  Good peripheral circulation. Respiratory: Normal respiratory effort.  No retractions. Lungs CTAB. Gastrointestinal: Soft and nontender. No distention.  No CVA tenderness. Genitourinary: deferred Musculoskeletal: No lower extremity tenderness nor edema.  Warm and well perfused Neurologic:  Normal speech and language. No gross focal neurologic deficits are appreciated.  Skin:  Skin is warm, dry and intact. No rash noted. Psychiatric: Mood and affect are normal. Speech and behavior are normal.  ____________________________________________   LABS (all labs ordered are listed, but only abnormal results are displayed)  Labs Reviewed  BASIC METABOLIC PANEL - Abnormal; Notable for the following:       Result Value   Glucose, Bld 105 (*)    All other components within normal limits  CBC - Abnormal; Notable for the following:    WBC 11.8 (*)    All other components within normal limits  TROPONIN I   ____________________________________________  EKG  ED ECG REPORT I, Jene Every, the attending physician, personally viewed and interpreted this ECG.  Date: 03/03/2017  Rhythm: normal sinus rhythm QRS Axis: normal Intervals: Incomplete right bundle branch block ST/T Wave abnormalities: normal Narrative Interpretation:  ____________________________________________  RADIOLOGY  Chest x-ray unremarkable ____________________________________________   PROCEDURES  Procedure(s) performed: No    Critical Care performed: No ____________________________________________   INITIAL IMPRESSION / ASSESSMENT AND PLAN / ED COURSE  Pertinent labs & imaging results that were available during my care of the patient were reviewed by me and  considered in my medical decision making (see chart for details).  Differential includes angina, ACS, nonspecific chest pain, chest wall pain  Patient well-appearing and in no acute distress. EKG is overall unremarkable, troponin is normal. He is chest pain-free currently.  Discussed with Dr. Welton Flakes of cardiology. He recommends outpatient follow up with him tomorrow at 0900. Patient agrees with this plan. He knows to return if any worsening of his symptoms    ____________________________________________   FINAL CLINICAL IMPRESSION(S) / ED DIAGNOSES  Final diagnoses:  Chest pain, unspecified type      NEW MEDICATIONS STARTED DURING THIS VISIT:  New Prescriptions   No medications on file     Note:  This document was prepared using Dragon voice recognition software and may include unintentional dictation errors.    Jene Every, MD 03/03/17 1504

## 2018-02-14 ENCOUNTER — Emergency Department
Admission: EM | Admit: 2018-02-14 | Discharge: 2018-02-14 | Disposition: A | Discharge status: Home / Self Care | Payer: Self-pay | Attending: Emergency Medicine | Admitting: Emergency Medicine

## 2018-02-14 ENCOUNTER — Encounter: Payer: Self-pay | Admitting: Emergency Medicine

## 2018-02-14 ENCOUNTER — Emergency Department: Payer: Self-pay

## 2018-02-14 DIAGNOSIS — I252 Old myocardial infarction: Secondary | ICD-10-CM | POA: Insufficient documentation

## 2018-02-14 DIAGNOSIS — R0789 Other chest pain: Secondary | ICD-10-CM | POA: Insufficient documentation

## 2018-02-14 DIAGNOSIS — I2510 Atherosclerotic heart disease of native coronary artery without angina pectoris: Secondary | ICD-10-CM | POA: Insufficient documentation

## 2018-02-14 DIAGNOSIS — F17200 Nicotine dependence, unspecified, uncomplicated: Secondary | ICD-10-CM | POA: Insufficient documentation

## 2018-02-14 DIAGNOSIS — I10 Essential (primary) hypertension: Secondary | ICD-10-CM | POA: Insufficient documentation

## 2018-02-14 DIAGNOSIS — R079 Chest pain, unspecified: Secondary | ICD-10-CM

## 2018-02-14 DIAGNOSIS — Z7982 Long term (current) use of aspirin: Secondary | ICD-10-CM | POA: Insufficient documentation

## 2018-02-14 DIAGNOSIS — Z79899 Other long term (current) drug therapy: Secondary | ICD-10-CM | POA: Insufficient documentation

## 2018-02-14 LAB — CBC
HCT: 42.3 % (ref 40.0–52.0)
Hemoglobin: 14.3 g/dL (ref 13.0–18.0)
MCH: 31.4 pg (ref 26.0–34.0)
MCHC: 33.8 g/dL (ref 32.0–36.0)
MCV: 92.9 fL (ref 80.0–100.0)
Platelets: 179 K/uL (ref 150–440)
RBC: 4.55 MIL/uL (ref 4.40–5.90)
RDW: 13.7 % (ref 11.5–14.5)
WBC: 9.5 K/uL (ref 3.8–10.6)

## 2018-02-14 LAB — BASIC METABOLIC PANEL WITH GFR
Anion gap: 6 (ref 5–15)
BUN: 16 mg/dL (ref 6–20)
CO2: 22 mmol/L (ref 22–32)
Calcium: 8.8 mg/dL — ABNORMAL LOW (ref 8.9–10.3)
Chloride: 106 mmol/L (ref 98–111)
Creatinine, Ser: 0.81 mg/dL (ref 0.61–1.24)
GFR calc Af Amer: >60 mL/min
GFR calc non Af Amer: >60 mL/min
Glucose, Bld: 101 mg/dL — ABNORMAL HIGH (ref 70–99)
Potassium: 4 mmol/L (ref 3.5–5.1)
Sodium: 134 mmol/L — ABNORMAL LOW (ref 135–145)

## 2018-02-14 LAB — TROPONIN I: Troponin I: <0.03 ng/mL

## 2018-02-14 MED ORDER — ISOSORBIDE MONONITRATE ER 60 MG PO TB24
120.0000 mg | ORAL_TABLET | Freq: Every day | ORAL | Status: DC
  Filled 2018-02-14: qty 2

## 2018-02-14 NOTE — ED Triage Notes (Signed)
Patient reports chest tightness intermittently with shortness of breath intermittently this morning.  Patient reports known heart blockages.  Patient has history of heart attacks.  Patient is in no obvious distress at this time.

## 2018-02-14 NOTE — ED Provider Notes (Signed)
[facility] Emergency Department Provider Note   ____________________________________________    I have reviewed the triage vital signs and the nursing notes.   HISTORY  Chief Complaint Chest Pain     HPI Elijah Fitzpatrick is a 57 y.o. male with a history of coronary artery disease who presents with complaints of chest discomfort which is been intermittent over the last several days.  Patient reports he gets a flushing sensation in his chest particularly with exertion which goes away when he rests.  He does report seeing his cardiologist last week.  He denies shortness of breath.  No fevers or chills or cough.  No calf pain or swelling.  No pleurisy.  Currently feels quite well and has no complaints  Past Medical History:  Diagnosis Date  . Blocked artery   . Coronary artery disease   . Hypertension   . Myocardial infarction (HCC)    x3 in 2013    Patient Active Problem List   Diagnosis Date Noted  . Nausea vomiting and diarrhea   . SBO (small bowel obstruction) (HCC) 12/29/2015  . Intraoperative bladder injury     Past Surgical History:  Procedure Laterality Date  . BLADDER REPAIR  12/18/2015   Procedure: BLADDER REPAIR;  Surgeon: Gladis Riffle, MD;  Location: [facility] ORS;  Service: General;;  . HERNIA REPAIR     Bilateral open in 1990s  . INGUINAL HERNIA REPAIR Right 12/18/2015   Procedure: HERNIA REPAIR INGUINAL ADULT;  Surgeon: Gladis Riffle, MD;  Location: [facility] ORS;  Service: General;  Laterality: Right;    Prior to Admission medications   Medication Sig Start Date End Date Taking? Authorizing Provider  aspirin EC 81 MG tablet Take 81 mg by mouth daily.   Yes [provider]  atorvastatin (LIPITOR) 80 MG tablet Take 80 mg by mouth daily.   Yes [provider]  isosorbide mononitrate (IMDUR) 120 MG 24 hr tablet Take 120 mg by mouth daily.   Yes [provider]  losartan (COZAAR) 25 MG tablet Take 25 mg by  mouth daily.   Yes [provider]  metoprolol succinate (TOPROL-XL) 50 MG 24 hr tablet Take 50 mg by mouth daily. Take with or immediately following a meal.   Yes [provider]  nitroGLYCERIN (NITROSTAT) 0.4 MG SL tablet Place 0.4 mg under the tongue every 5 (five) minutes as needed for chest pain.   Yes [provider]  omeprazole (PRILOSEC) 20 MG capsule Take 20 mg by mouth daily.   Yes [provider]  ranolazine (RANEXA) 500 MG 12 hr tablet Take 500 mg by mouth 2 (two) times daily.   Yes [provider]  ticagrelor (BRILINTA) 90 MG TABS tablet Take 90 mg by mouth 2 (two) times daily.   Yes [provider]  alum & mag hydroxide-simeth (MAALOX/MYLANTA) 200-200-20 MG/5ML suspension Take 30 mLs by mouth every 4 (four) hours as needed for indigestion. Patient not taking: Reported on 03/03/2017 01/01/16   Sterling Big F, MD  metoprolol tartrate (LOPRESSOR) 25 MG tablet Take 1 tablet (25 mg total) by mouth 2 (two) times daily. Patient not taking: Reported on 03/03/2017 12/19/15   Lattie Haw, MD  omeprazole (PRILOSEC) 40 MG capsule Take 1 capsule (40 mg total) by mouth daily. Patient not taking: Reported on 03/03/2017 01/01/16   Sterling Big F, MD  ondansetron (ZOFRAN) 4 MG tablet Take 1 tablet (4 mg total) by mouth every 6 (six) hours as  needed for nausea. Patient not taking: Reported on 03/03/2017 01/01/16   Leafy Ro, MD     Allergies Iodinated diagnostic agents  Family History  Problem Relation Age of Onset  . Heart disease Father   . Cancer Maternal Uncle     Social History Social History   Tobacco Use  . Smoking status: Current Every Day Smoker    Packs/day: 1.00    Years: 40.00    Pack years: 40.00  . Smokeless tobacco: Former Engineer, water Use Topics  . Alcohol use: No  . Drug use: No    Review of Systems  Constitutional: No fever/chills Eyes: No visual changes.  ENT: No sore throat. Cardiovascular: As  above Respiratory: Denies shortness of breath. Gastrointestinal: No abdominal pain.  No nausea, no vomiting.   Genitourinary: Negative for dysuria. Musculoskeletal: Negative for back pain. Skin: Negative for rash. Neurological: Negative for headaches or weakness   ____________________________________________   PHYSICAL EXAM:  VITAL SIGNS: ED Triage Vitals  Enc Vitals Group     BP 02/14/18 1156 122/85     Pulse Rate 02/14/18 1156 67     Resp 02/14/18 1156 18     Temp 02/14/18 1156 98.3 F (36.8 C)     Temp Source 02/14/18 1156 Oral     SpO2 02/14/18 1156 98 %     Weight 02/14/18 1158 93.9 kg (207 lb)     Height 02/14/18 1158 1.829 m (6')     Head Circumference --      Peak Flow --      Pain Score 02/14/18 1158 0     Pain Loc --      Pain Edu? --      Excl. in GC? --     Constitutional: Alert and oriented. No acute distress. Pleasant and interactive  Nose: No congestion/rhinnorhea. Mouth/Throat: Mucous membranes are moist.    Cardiovascular: Normal rate, regular rhythm. Grossly normal heart sounds.  Good peripheral circulation. Respiratory: Normal respiratory effort.  No retractions. Lungs CTAB. Gastrointestinal: Soft and nontender. No distention.  No CVA tenderness.  Musculoskeletal: No lower extremity tenderness nor edema.  Warm and well perfused Neurologic:  Normal speech and language. No gross focal neurologic deficits are appreciated.  Skin:  Skin is warm, dry and intact. No rash noted. Psychiatric: Mood and affect are normal. Speech and behavior are normal.  ____________________________________________   LABS (all labs ordered are listed, but only abnormal results are displayed)  Labs Reviewed  BASIC METABOLIC PANEL - Abnormal; Notable for the following components:      Result Value   Sodium 134 (*)    Glucose, Bld 101 (*)    Calcium 8.8 (*)    All other components within normal limits  CBC  TROPONIN I    ____________________________________________  EKG  ED ECG REPORT I, Jene Every, the attending physician, personally viewed and interpreted this ECG.  Date: 02/14/2018  Rhythm: normal sinus rhythm QRS Axis: normal Intervals: normal ST/T Wave abnormalities: normal Narrative Interpretation: no evidence of acute ischemia  ____________________________________________  RADIOLOGY  Chest x-ray normal ____________________________________________   PROCEDURES  Procedure(s) performed: No  Procedures   Critical Care performed: No ____________________________________________   INITIAL IMPRESSION / ASSESSMENT AND PLAN / ED COURSE  Pertinent labs & imaging results that were available during my care of the patient were reviewed by me and considered in my medical decision making (see chart for details).  Patient is feeling quite well in the emergency department, EKG is normal,  troponin is normal.  Lab work unremarkable.  I discussed with the patient's cardiologist, Dr. Welton Flakes who asks that we give the patient 120 mg of imdur and he will see the patient tomorrow at 10 am.  Patient to avoid exertional activity until then.  Patient is quite comfort with this plan.  Return precautions discussed    ____________________________________________   FINAL CLINICAL IMPRESSION(S) / ED DIAGNOSES  Final diagnoses:  Nonspecific chest pain        Note:  This document was prepared using Dragon voice recognition software and may include unintentional dictation errors.    Jene Every, MD 02/14/18 [phone]

## 2018-02-16 ENCOUNTER — Encounter
Admission: RE | Disposition: A | Discharge status: Home / Self Care | Payer: Self-pay | Source: Ambulatory Visit | Attending: Cardiovascular Disease

## 2018-02-16 ENCOUNTER — Ambulatory Visit
Admission: RE | Admit: 2018-02-16 | Discharge: 2018-02-16 | Disposition: A | Discharge status: Home / Self Care | Payer: Self-pay | Source: Ambulatory Visit | Attending: Cardiovascular Disease | Admitting: Cardiovascular Disease

## 2018-02-16 ENCOUNTER — Other Ambulatory Visit: Payer: Self-pay | Admitting: Cardiovascular Disease

## 2018-02-16 DIAGNOSIS — I10 Essential (primary) hypertension: Secondary | ICD-10-CM | POA: Insufficient documentation

## 2018-02-16 DIAGNOSIS — F17210 Nicotine dependence, cigarettes, uncomplicated: Secondary | ICD-10-CM | POA: Insufficient documentation

## 2018-02-16 DIAGNOSIS — M1990 Unspecified osteoarthritis, unspecified site: Secondary | ICD-10-CM | POA: Insufficient documentation

## 2018-02-16 DIAGNOSIS — Z7982 Long term (current) use of aspirin: Secondary | ICD-10-CM | POA: Insufficient documentation

## 2018-02-16 DIAGNOSIS — I200 Unstable angina: Secondary | ICD-10-CM

## 2018-02-16 DIAGNOSIS — E785 Hyperlipidemia, unspecified: Secondary | ICD-10-CM | POA: Insufficient documentation

## 2018-02-16 DIAGNOSIS — R0600 Dyspnea, unspecified: Secondary | ICD-10-CM | POA: Insufficient documentation

## 2018-02-16 DIAGNOSIS — Z8249 Family history of ischemic heart disease and other diseases of the circulatory system: Secondary | ICD-10-CM | POA: Insufficient documentation

## 2018-02-16 DIAGNOSIS — Z79899 Other long term (current) drug therapy: Secondary | ICD-10-CM | POA: Insufficient documentation

## 2018-02-16 DIAGNOSIS — K219 Gastro-esophageal reflux disease without esophagitis: Secondary | ICD-10-CM | POA: Insufficient documentation

## 2018-02-16 DIAGNOSIS — Z9889 Other specified postprocedural states: Secondary | ICD-10-CM | POA: Insufficient documentation

## 2018-02-16 DIAGNOSIS — I25118 Atherosclerotic heart disease of native coronary artery with other forms of angina pectoris: Secondary | ICD-10-CM | POA: Insufficient documentation

## 2018-02-16 HISTORY — PX: LEFT HEART CATH AND CORONARY ANGIOGRAPHY: CATH118249

## 2018-02-16 HISTORY — DX: Unspecified osteoarthritis, unspecified site: M19.90

## 2018-02-16 HISTORY — DX: Other specified postprocedural states: Z98.890

## 2018-02-16 SURGERY — LEFT HEART CATH
Anesthesia: Moderate Sedation | Laterality: Right

## 2018-02-16 SURGERY — LEFT HEART CATH AND CORONARY ANGIOGRAPHY
Anesthesia: Moderate Sedation | Laterality: Left

## 2018-02-16 MED ORDER — METHYLPREDNISOLONE SODIUM SUCC 125 MG IJ SOLR
INTRAMUSCULAR | Status: DC | PRN
  Administered 2018-02-16: 125 mg via INTRAVENOUS

## 2018-02-16 MED ORDER — FENTANYL CITRATE (PF) 100 MCG/2ML IJ SOLN
INTRAMUSCULAR | Status: DC | PRN
  Administered 2018-02-16: 50 ug via INTRAVENOUS

## 2018-02-16 MED ORDER — DIPHENHYDRAMINE HCL 50 MG/ML IJ SOLN
INTRAMUSCULAR | Status: DC | PRN
  Administered 2018-02-16: 50 mg via INTRAVENOUS

## 2018-02-16 MED ORDER — HEPARIN (PORCINE) IN NACL 1000-0.9 UT/500ML-% IV SOLN
INTRAVENOUS | Status: AC
  Filled 2018-02-16: qty 1000

## 2018-02-16 MED ORDER — MIDAZOLAM HCL 2 MG/2ML IJ SOLN
INTRAMUSCULAR | Status: DC | PRN
  Administered 2018-02-16: 1 mg via INTRAVENOUS

## 2018-02-16 MED ORDER — SODIUM CHLORIDE 0.9 % WEIGHT BASED INFUSION
3.0000 mL/kg/h | INTRAVENOUS | Status: AC
  Administered 2018-02-16: 3 mL/kg/h via INTRAVENOUS

## 2018-02-16 MED ORDER — SODIUM CHLORIDE 0.9 % IV SOLN: 250.0000 mL | INTRAVENOUS | Status: DC | PRN

## 2018-02-16 MED ORDER — SODIUM CHLORIDE 0.9% FLUSH: 3.0000 mL | INTRAVENOUS | Status: DC | PRN

## 2018-02-16 MED ORDER — FENTANYL CITRATE (PF) 100 MCG/2ML IJ SOLN
INTRAMUSCULAR | Status: AC
  Filled 2018-02-16: qty 2

## 2018-02-16 MED ORDER — MIDAZOLAM HCL 2 MG/2ML IJ SOLN
INTRAMUSCULAR | Status: AC
  Filled 2018-02-16: qty 2

## 2018-02-16 MED ORDER — SODIUM CHLORIDE 0.9 % WEIGHT BASED INFUSION: 1.0000 mL/kg/h | INTRAVENOUS | Status: DC

## 2018-02-16 MED ORDER — DIPHENHYDRAMINE HCL 50 MG/ML IJ SOLN
INTRAMUSCULAR | Status: AC
  Filled 2018-02-16: qty 1

## 2018-02-16 MED ORDER — METHYLPREDNISOLONE SODIUM SUCC 125 MG IJ SOLR
INTRAMUSCULAR | Status: AC
  Filled 2018-02-16: qty 2

## 2018-02-16 MED ORDER — SODIUM CHLORIDE 0.9% FLUSH: 3.0000 mL | Freq: Two times a day (BID) | INTRAVENOUS | Status: DC

## 2018-02-16 MED ORDER — ASPIRIN 81 MG PO CHEW: 81.0000 mg | CHEWABLE_TABLET | ORAL | Status: DC

## 2018-02-16 SURGICAL SUPPLY — 9 items
CATH INFINITI 5FR ANG PIGTAIL (CATHETERS) ×3 IMPLANT
CATH INFINITI 5FR JL4 (CATHETERS) ×3 IMPLANT
CATH INFINITI JR4 5F (CATHETERS) ×3 IMPLANT
DEVICE CLOSURE MYNXGRIP 5F (Vascular Products) ×3 IMPLANT
KIT MANI 3VAL PERCEP (MISCELLANEOUS) ×3 IMPLANT
NEEDLE PERC 18GX7CM (NEEDLE) ×3 IMPLANT
PACK CARDIAC CATH (CUSTOM PROCEDURE TRAY) ×3 IMPLANT
SHEATH AVANTI 5FR X 11CM (SHEATH) ×3 IMPLANT
WIRE GUIDERIGHT .035X150 (WIRE) ×3 IMPLANT

## 2018-02-16 NOTE — Progress Notes (Signed)
Pt. States he had a CCTA yesterday 02/15/2018 without any reaction at all to the dye-No hives, edema, itching, redness. "I only had a reaction one time when I had a hernia repair; and that was in 2017." Cath lab team made aware. Pt. Received from MD office . Denies any CP, SOB, N/V, HA, dizziness at present. New consent obtained now.

## 2018-04-12 IMAGING — CT CT ABD-PELV W/ CM
2 of 5 series · 14 of 46 positions shown, 16 images · IV contrast (iopamidol)
Comparison: Cystogram 12/24/2015 reviewed

CLINICAL DATA: Nausea, vomiting, abdominal pain and diarrhea for 2
days. Recent right inguinal hernia surgery 2 weeks prior with
bladder injury treated by primary closure.

EXAM:
CT ABDOMEN AND PELVIS WITH CONTRAST
TECHNIQUE: Multidetector CT imaging of the abdomen and pelvis was performed
using the standard protocol following bolus administration of
intravenous contrast.
CONTRAST:  100mL ZWY91T-LJJ IOPAMIDOL (ZWY91T-LJJ) INJECTION 61%

[Series 2: routine abd pel with · axial · 0.74mm/px · z∈[-1179,-754]mm · 11 of 99 slices shown, 13 images]
[im 7/99  soft-tissue]
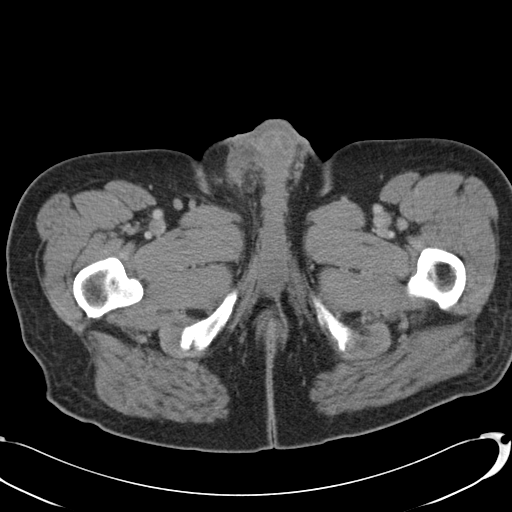
[im 7/99  bone]
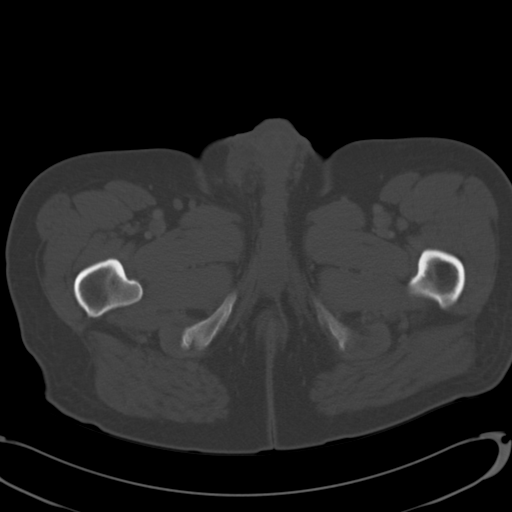
[im 14/99  soft-tissue]
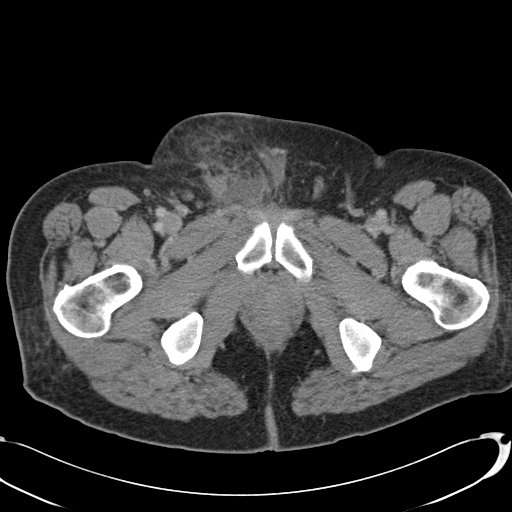
[im 27/99  soft-tissue]
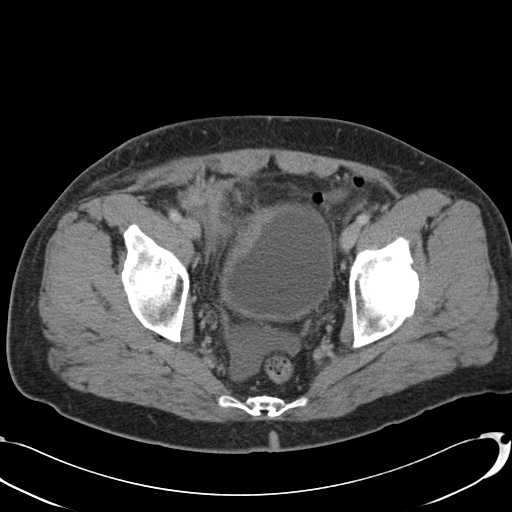
[im 33/99  soft-tissue]
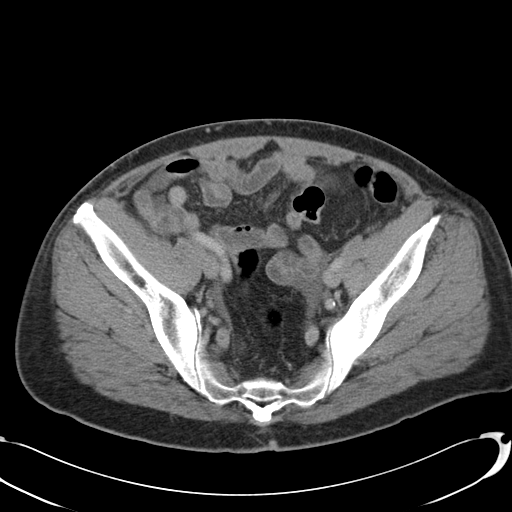
[im 40/99  soft-tissue]
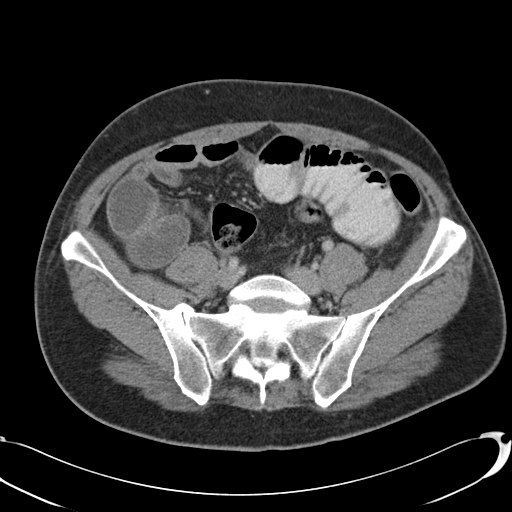
[im 53/99  soft-tissue]
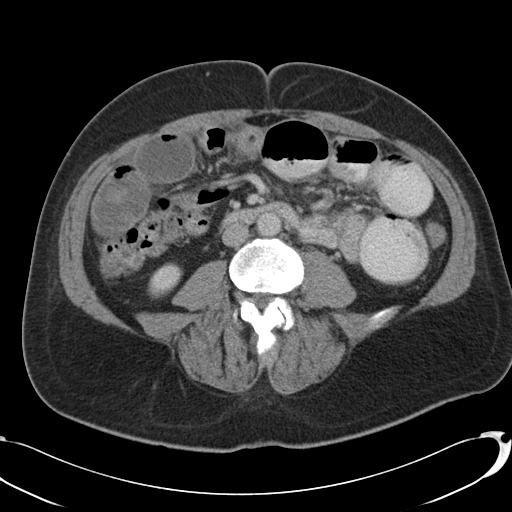
[im 59/99  soft-tissue]
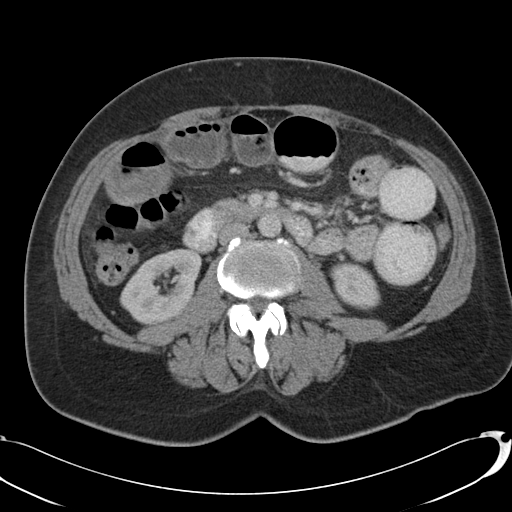
[im 66/99  soft-tissue]
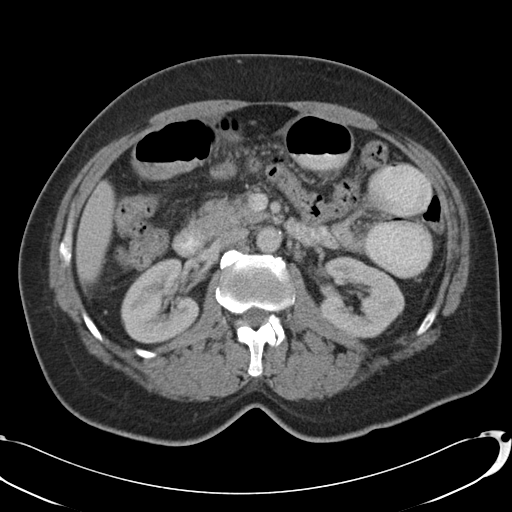
[im 72/99  soft-tissue]
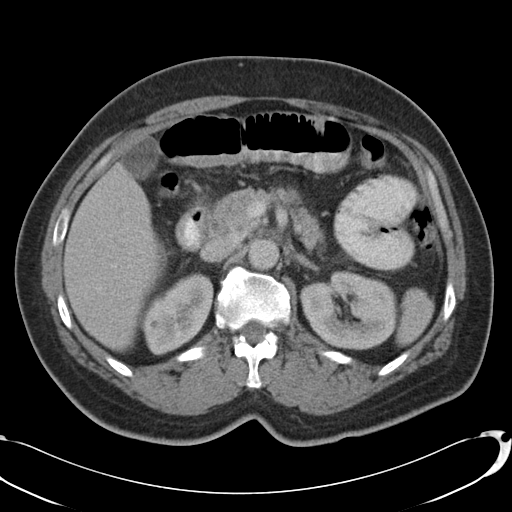
[im 72/99  bone]
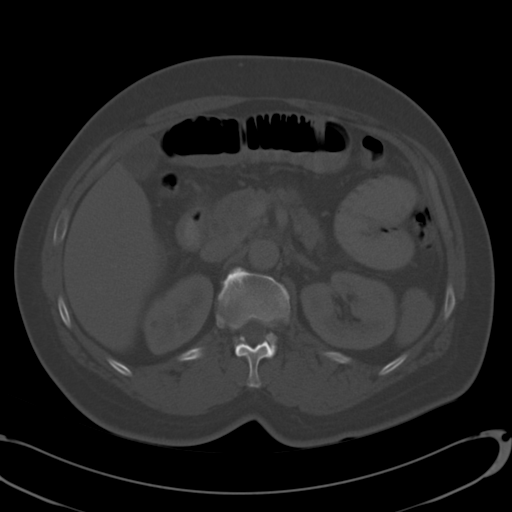
[im 85/99  soft-tissue]
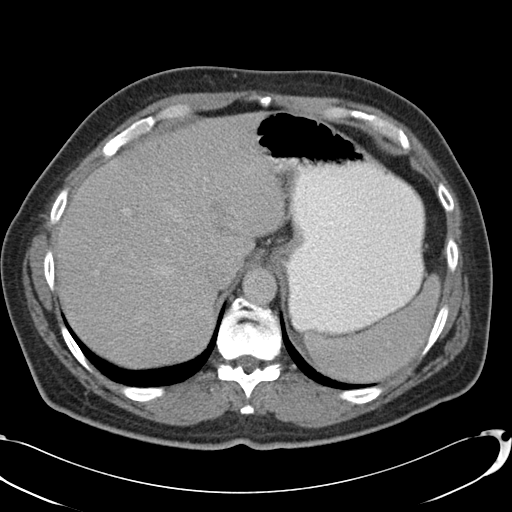
[im 92/99  soft-tissue]
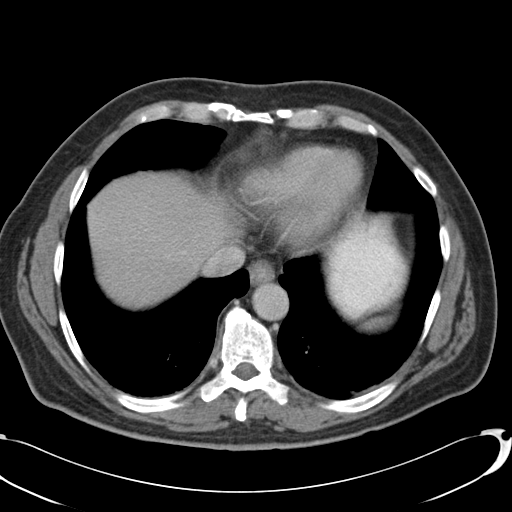

[Series 5: cor routine abd pel with · coronal · 0.78mm/px · 3 of 144 slices shown]
[im 48/144  soft-tissue]
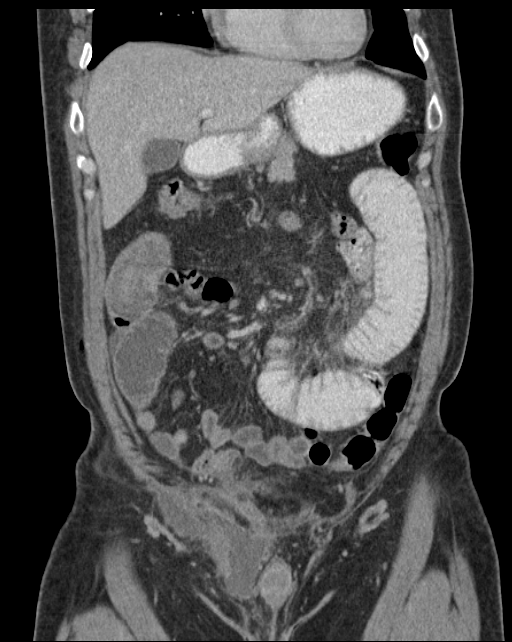
[im 64/144  soft-tissue]
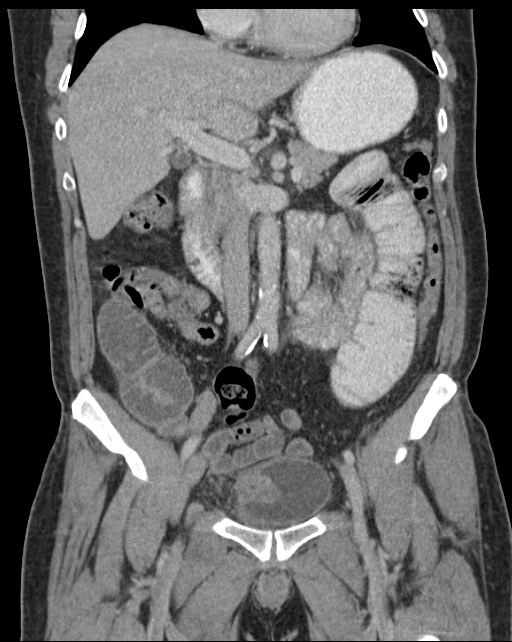
[im 80/144  soft-tissue]
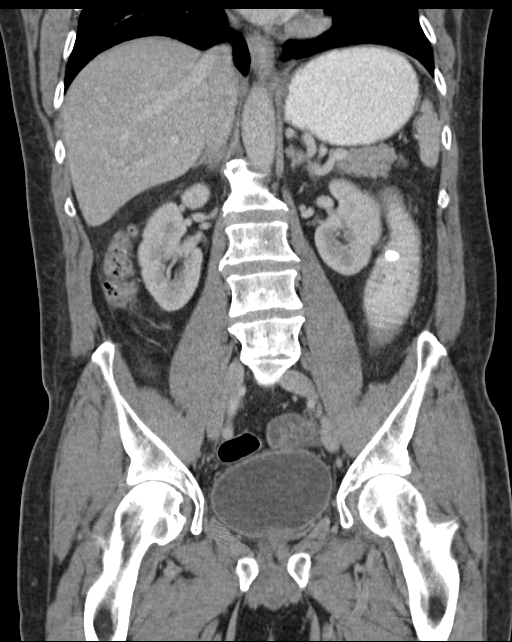

[14 of 46 positions shown; findings below may reference images not displayed]

FINDINGS: Lower chest: Minimal left basilar atelectasis or scarring. No
pleural effusion. Heart size is normal.

Liver: Tiny scattered sub cm hypodensities throughout both lobes of
the liver, too small to characterize, may be small cysts,
hemangiomas or hamartomas.

Hepatobiliary: Gallbladder physiologically distended, no calcified
stone. No biliary dilatation.

Pancreas: No ductal dilatation or inflammation.

Spleen: Normal.

Adrenal glands: No nodule.

Kidneys: Symmetric renal enhancement and excretion. No
hydronephrosis. There is a 2.7 cm cyst in the lower left kidney.

Stomach/Bowel: The stomach is distended with ingested contrast.
Dilated fluid and contrast filled proximal small bowel loops with
fecalization of small bowel contents in the right abdomen. There is
a transition point in the anterior right mid abdomen axial image 47,
coronal reformat image 31, sagittal reformat image 83. Small amount
of mesenteric edema and free fluid in the right lower quadrant. The
more distal small bowel loops are decompressed. Right lower quadrant
small bowel loops abutting the right inguinal hernia repair are
decompressed. No evidence of recurrent hernia. Small volume of stool
throughout the colon without colonic wall thickening. The appendix
is not visualized.

Vascular/Lymphatic: No retroperitoneal adenopathy. Abdominal aorta
is normal in caliber. Moderate atherosclerosis of the abdominal
aorta, no aneurysm.

Reproductive: Central prostatic calcification.

Bladder: Contour deformity and wall thickening about the right
lateral aspect, likely site of bladder repair. Mild perivesicular
soft tissue stranding. No evidence of contrast extravasation on
delayed phase imaging. No intravesicular air.

Other: Postsurgical change in the right inguinal region with
ill-defined fluid tracking from the subcutaneous tissues into the
inguinal canal. Fluid is serpiginous in shape measuring
approximately 8.4 x 2.6 x 7.7 cm. There is no internal air, definite
thick wall or peripheral enhancement. Adjacent subcutaneous edema in
the right groin. There is small amount of simple free fluid
dependently in the pelvis without organized fluid collection. No
free intra-abdominal air.

Musculoskeletal: There are no acute or suspicious osseous
abnormalities. Fusion of L4-L5 vertebra with segmentation anomaly,
suspect congenital. Additional degenerative change in the lumbar
spine at other levels.
IMPRESSION: 1. Findings consistent with small-bowel obstruction with transition
point in the mid right abdomen, possible due to adhesions.
Transition point is not in the vicinity of the right inguinal hernia
repair, there is no evidence of recurrent hernia.
2. Postsurgical change in the right inguinal region with ill-defined
fluid collection in the subcutaneous tissues tracking into the right
inguinal canal. This may be a postsurgical seroma, no specific
imaging findings to suggest abscess, however infected fluid is
difficult to exclude on imaging findings alone. Small amount of free
fluid in the pelvis without well-defined intra-abdominal/pelvic
abscess.
3. Wall thickening and contour deformity right aspect of the urinary
bladder, likely site of bladder injury and repair. No contrast
extravasation on delayed phase imaging to suggest bladder leak. Mild
residual inflammation.

## 2020-05-29 IMAGING — CR DG CHEST 2V
1 series · 2 of 2 positions shown · non-contrast
Comparison: Chest x-ray of 03/03/2017

CLINICAL DATA: Chest tightness intermittently, some shortness of
breath today, history of prior myocardial infarctions

EXAM:
CHEST - 2 VIEW

[Series 1: w chest pa · 0.14mm/px · 2 of 2 slices shown]
[im 1/2]
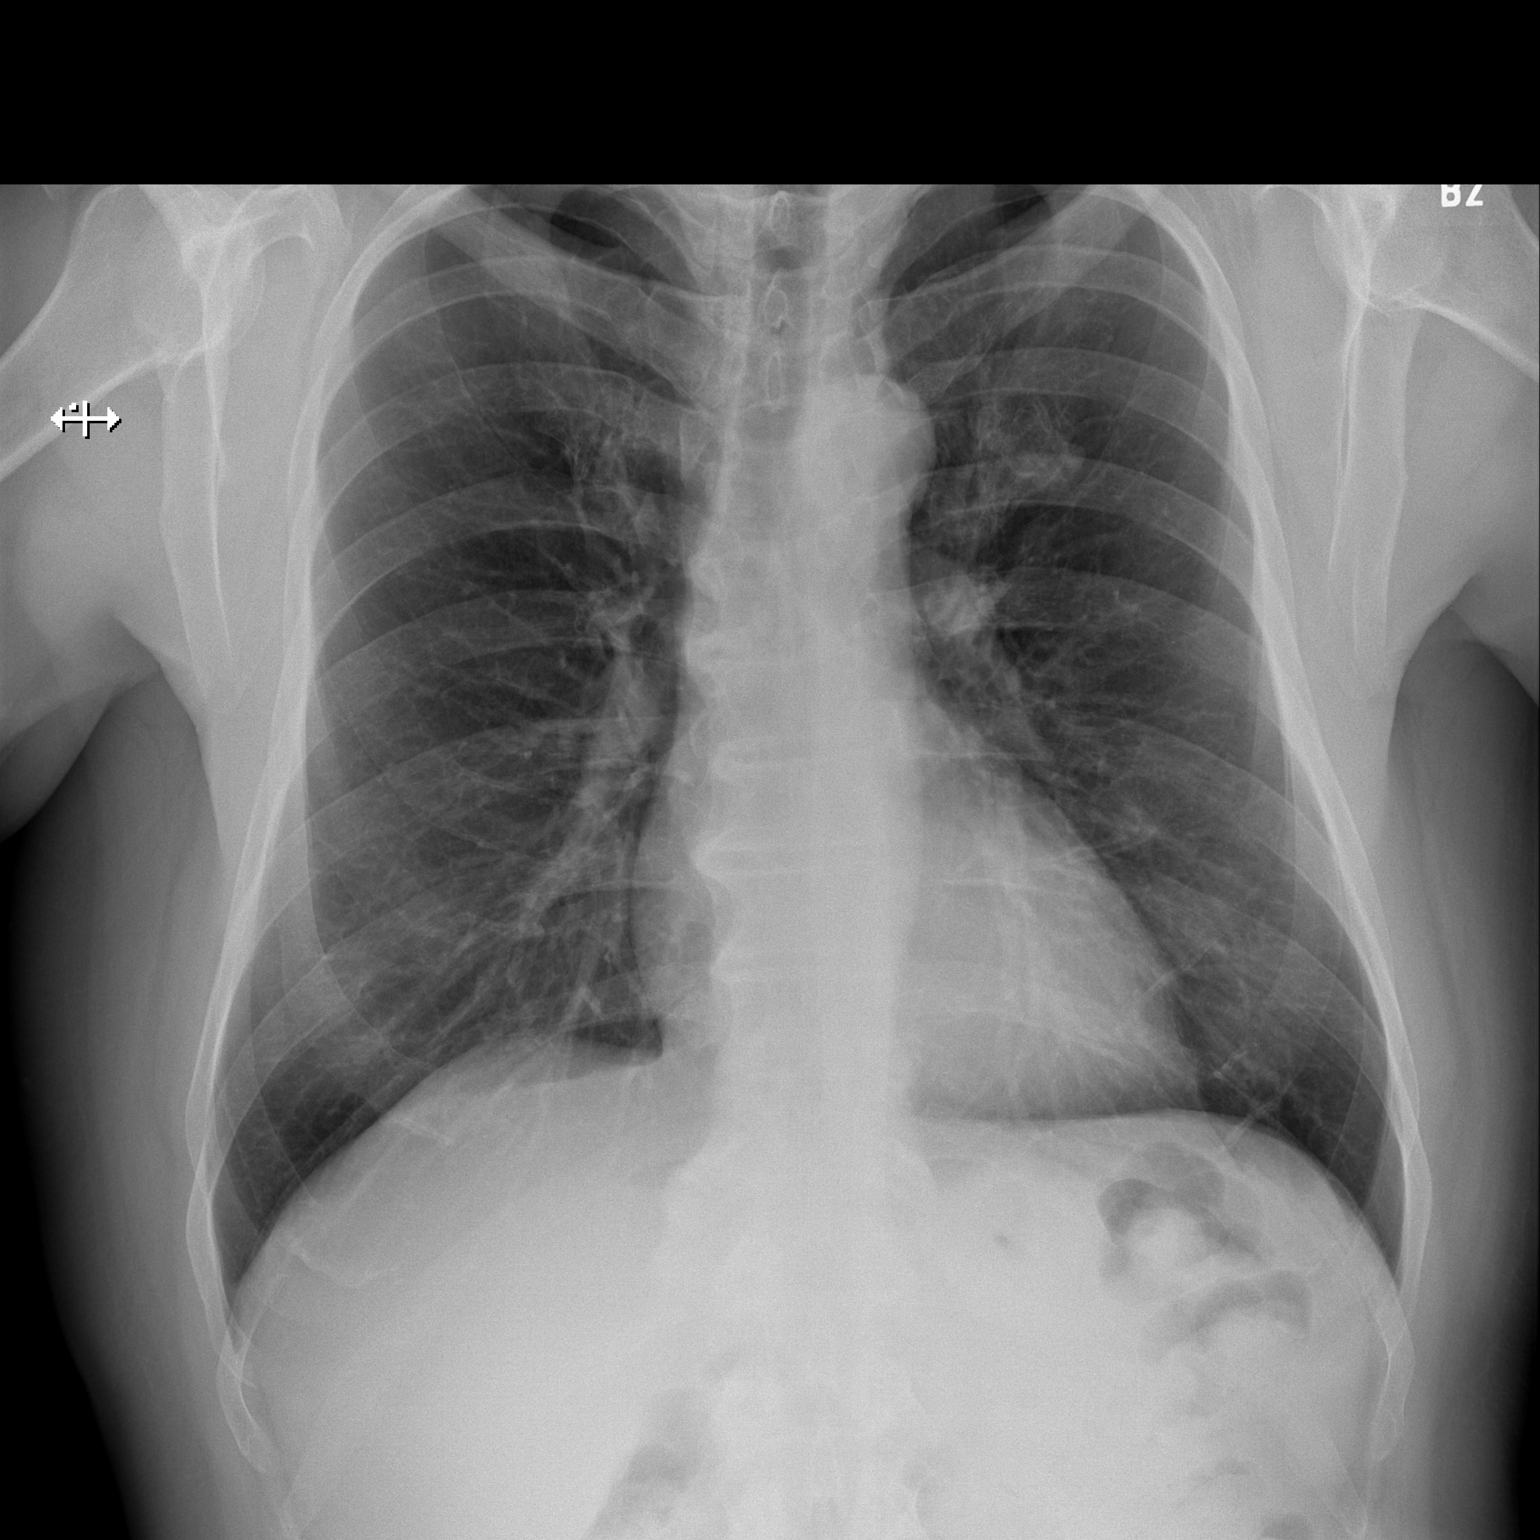
[im 2/2]
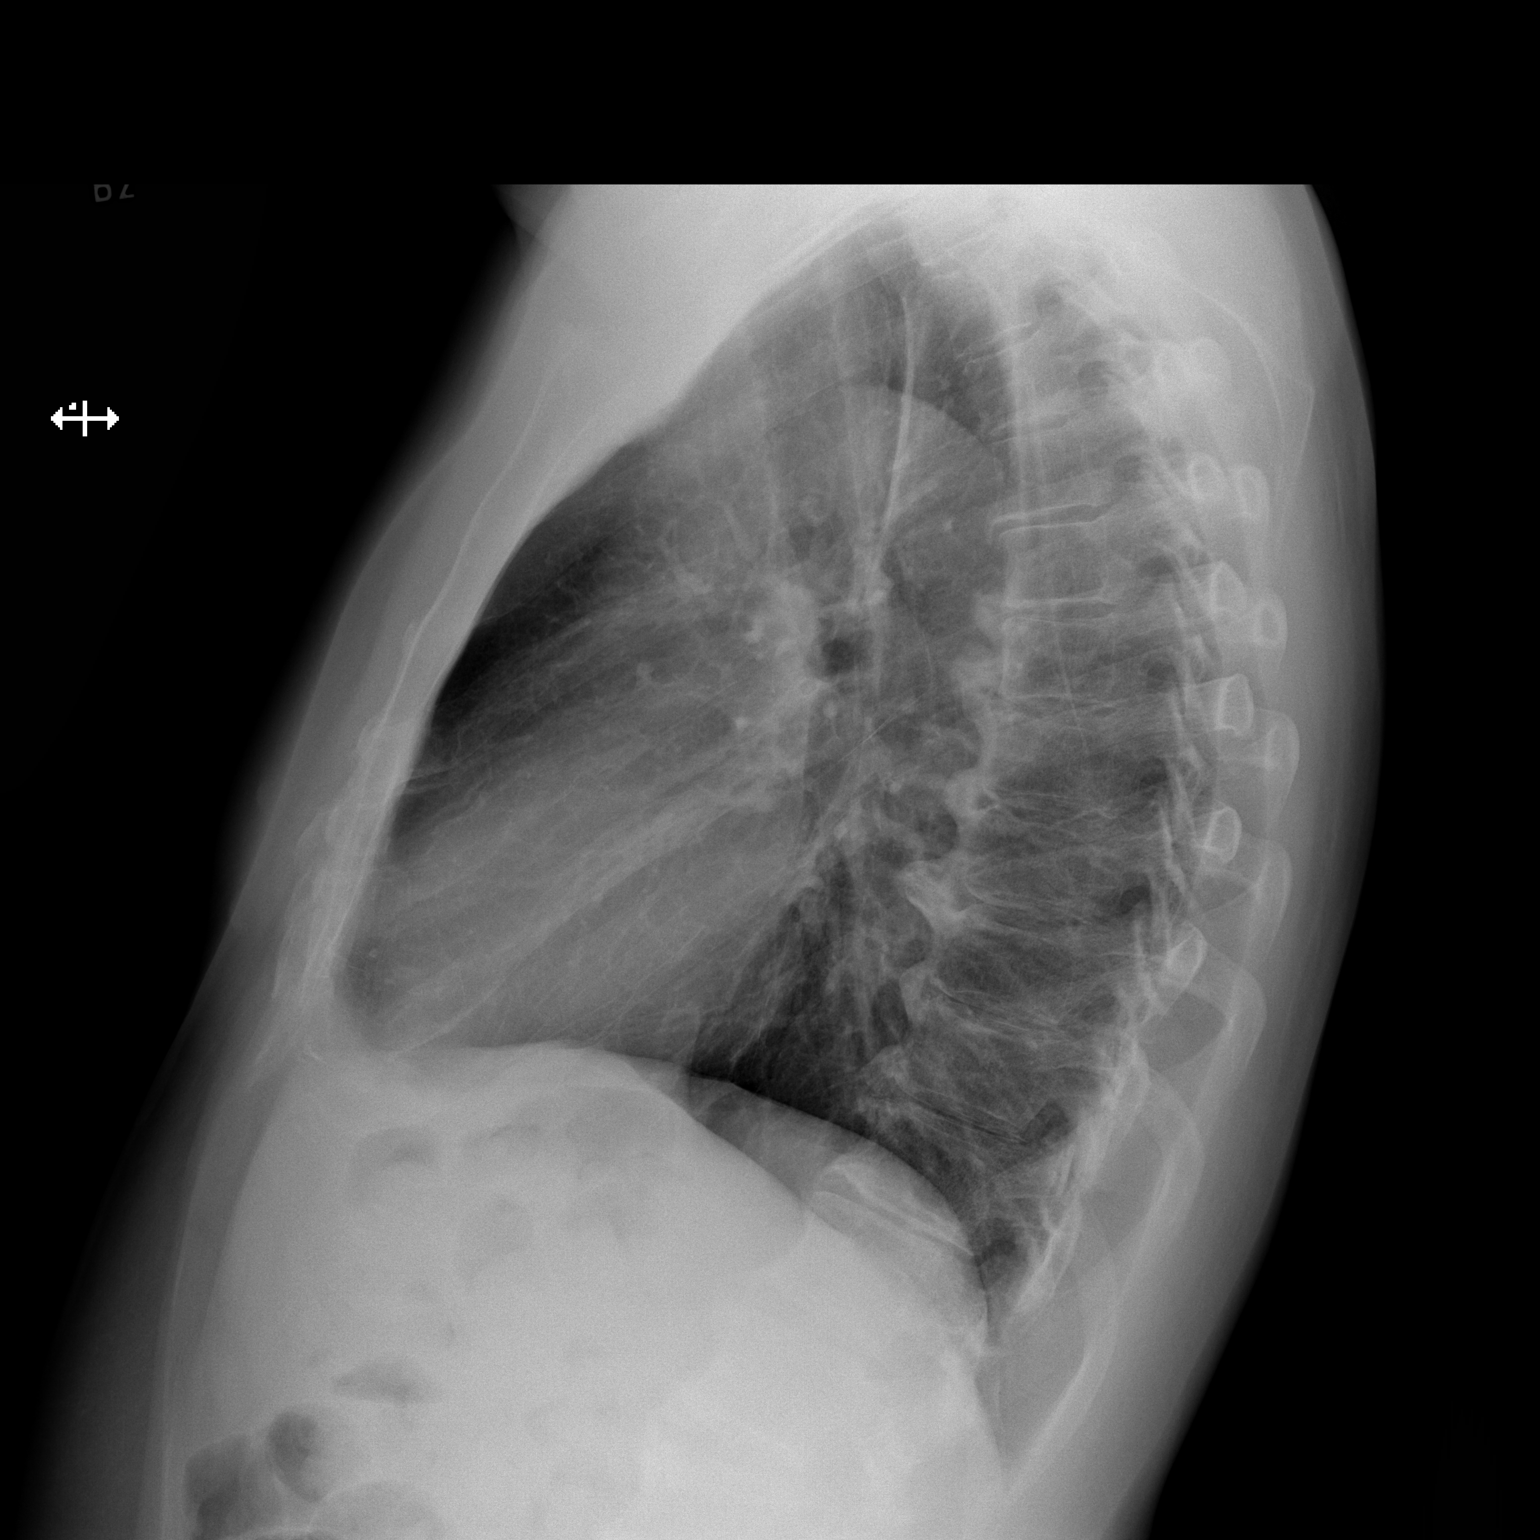

[2 of 2 positions shown; findings below may reference images not displayed]

FINDINGS: No active infiltrate or effusion is seen. Mediastinal and hilar
contours are unremarkable. The heart is within normal limits in size
and stable. There are diffuse degenerative changes throughout the
thoracic spine.
IMPRESSION: No active cardiopulmonary disease.

## 2022-08-02 ENCOUNTER — Other Ambulatory Visit: Payer: Self-pay | Admitting: Cardiology

## 2022-08-02 ENCOUNTER — Other Ambulatory Visit: Payer: Self-pay | Admitting: Cardiovascular Disease

## 2022-08-06 ENCOUNTER — Other Ambulatory Visit: Payer: Self-pay

## 2022-08-06 MED ORDER — METOPROLOL SUCCINATE ER 25 MG PO TB24: 25.0000 mg | ORAL_TABLET | Freq: Every day | ORAL | 11 refills | Status: DC

## 2022-08-06 MED ORDER — HYDROCHLOROTHIAZIDE 12.5 MG PO TABS: 12.5000 mg | ORAL_TABLET | Freq: Every day | ORAL | 0 refills | Status: DC

## 2022-08-06 MED ORDER — ATORVASTATIN CALCIUM 80 MG PO TABS: 80.0000 mg | ORAL_TABLET | Freq: Every day | ORAL | 3 refills | Status: DC

## 2022-08-06 MED ORDER — LOSARTAN POTASSIUM 50 MG PO TABS: 50.0000 mg | ORAL_TABLET | Freq: Every day | ORAL | 0 refills | Status: DC

## 2022-11-29 ENCOUNTER — Encounter: Payer: Self-pay | Admitting: Cardiovascular Disease

## 2022-11-29 ENCOUNTER — Ambulatory Visit (INDEPENDENT_AMBULATORY_CARE_PROVIDER_SITE_OTHER): Payer: Self-pay | Admitting: Cardiovascular Disease

## 2022-11-29 VITALS — BP 112/66 | HR 67 | Ht 71.0 in | Wt 205.0 lb

## 2022-11-29 DIAGNOSIS — R0602 Shortness of breath: Secondary | ICD-10-CM

## 2022-11-29 DIAGNOSIS — I10 Essential (primary) hypertension: Secondary | ICD-10-CM

## 2022-11-29 DIAGNOSIS — E782 Mixed hyperlipidemia: Secondary | ICD-10-CM

## 2022-11-29 DIAGNOSIS — I340 Nonrheumatic mitral (valve) insufficiency: Secondary | ICD-10-CM

## 2022-11-29 DIAGNOSIS — I25810 Atherosclerosis of coronary artery bypass graft(s) without angina pectoris: Secondary | ICD-10-CM

## 2022-11-29 MED ORDER — OMEPRAZOLE 40 MG PO CPDR: 40.0000 mg | DELAYED_RELEASE_CAPSULE | Freq: Every day | ORAL | 2 refills | Status: DC

## 2022-11-29 NOTE — Progress Notes (Unsigned)
Cardiology Office Note   Date:  11/29/2022   ID:  Elijah Fitzpatrick, DOB 08-18-1960, MRN [id number]  PCP:  Patient, No Pcp Per  Cardiologist:  Adrian Blackwater, MD      History of Present Illness: Elijah Fitzpatrick is a 62 y.o. male who presents for  Chief Complaint  Patient presents with   Follow-up    6 Months Follow up    Denies chest pain, but has DOE      Past Medical History:  Diagnosis Date   Arthritis 2000   Blocked artery    Coronary artery disease    Hx of cardiac cath 2012   Hypertension    Myocardial infarction (HCC)    x3 in 2013     Past Surgical History:  Procedure Laterality Date   BLADDER REPAIR  12/18/2015   Procedure: BLADDER REPAIR;  Surgeon: Gladis Riffle, MD;  Location: [facility];  Service: General;;   HERNIA REPAIR     Bilateral open in 1990s   INGUINAL HERNIA REPAIR Right 12/18/2015   Procedure: HERNIA REPAIR INGUINAL ADULT;  Surgeon: Gladis Riffle, MD;  Location: [facility];  Service: General;  Laterality: Right;   LEFT HEART CATH AND CORONARY ANGIOGRAPHY Left 02/16/2018   Procedure: LEFT HEART CATH AND CORONARY ANGIOGRAPHY;  Surgeon: Laurier Nancy, MD;  Location: [facility];  Service: Cardiovascular;  Laterality: Left;     Current Outpatient Medications  Medication Sig Dispense Refill   aspirin EC 81 MG tablet Take 81 mg by mouth daily.     atorvastatin (LIPITOR) 80 MG tablet Take 1 tablet (80 mg total) by mouth daily. 90 tablet 3   hydrochlorothiazide (HYDRODIURIL) 12.5 MG tablet Take 1 tablet (12.5 mg total) by mouth daily. 90 tablet 0   isosorbide mononitrate (IMDUR) 120 MG 24 hr tablet Take 120 mg by mouth daily.     losartan (COZAAR) 50 MG tablet Take 1 tablet (50 mg total) by mouth daily. 90 tablet 0   metoprolol succinate (TOPROL XL) 25 MG 24 hr tablet Take 1 tablet (25 mg total) by mouth daily. 30 tablet 11   nitroGLYCERIN (NITROSTAT) 0.4 MG SL tablet Place 0.4 mg under the tongue every 5 (five) minutes as needed for  chest pain.     omeprazole (PRILOSEC) 20 MG capsule Take 20 mg by mouth daily.     omeprazole (PRILOSEC) 40 MG capsule Take 1 capsule (40 mg total) by mouth daily. 30 capsule 2   ondansetron (ZOFRAN) 4 MG tablet Take 1 tablet (4 mg total) by mouth every 6 (six) hours as needed for nausea. (Patient not taking: Reported on 03/03/2017) 20 tablet 0   ranolazine (RANEXA) 500 MG 12 hr tablet Take 500 mg by mouth 2 (two) times daily.     No current facility-administered medications for this visit.    Allergies:   Iodinated contrast media    Social History:   reports that he quit smoking about 4 years ago. He has a 40.00 pack-year smoking history. He has quit using smokeless tobacco. He reports that he does not drink alcohol and does not use drugs.   Family History:  family history includes Cancer in his maternal uncle; Heart disease in his father.    ROS:     Review of Systems  Constitutional: Negative.   HENT: Negative.    Eyes: Negative.   Respiratory: Negative.    Gastrointestinal: Negative.   Genitourinary: Negative.   Musculoskeletal: Negative.   Skin:  Negative.   Neurological: Negative.   Endo/Heme/Allergies: Negative.   Psychiatric/Behavioral: Negative.    All other systems reviewed and are negative.     All other systems are reviewed and negative.    PHYSICAL EXAM: VS:  BP 112/66   Pulse 67   Ht 5\' 11"  (1.803 m)   Wt 205 lb (93 kg)   SpO2 98%   BMI 28.59 kg/m  , BMI Body mass index is 28.59 kg/m. Last weight:  Wt Readings from Last 3 Encounters:  11/29/22 205 lb (93 kg)  02/16/18 207 lb (93.9 kg)  02/14/18 207 lb (93.9 kg)     Physical Exam Vitals reviewed.  Constitutional:      Appearance: Normal appearance. He is normal weight.  HENT:     Head: Normocephalic.     Nose: Nose normal.     Mouth/Throat:     Mouth: Mucous membranes are moist.  Eyes:     Pupils: Pupils are equal, round, and reactive to light.  Cardiovascular:     Rate and Rhythm: Normal  rate and regular rhythm.     Pulses: Normal pulses.     Heart sounds: Normal heart sounds.  Pulmonary:     Effort: Pulmonary effort is normal.  Abdominal:     General: Abdomen is flat. Bowel sounds are normal.  Musculoskeletal:        General: Normal range of motion.     Cervical back: Normal range of motion.  Skin:    General: Skin is warm.  Neurological:     General: No focal deficit present.     Mental Status: He is alert.  Psychiatric:        Mood and Affect: Mood normal.       EKG:   Recent Labs: No results found for requested labs within last 365 days.    Lipid Panel No results found for: "CHOL", "TRIG", "HDL", "CHOLHDL", "VLDL", "LDLCALC", "LDLDIRECT"    Other studies Reviewed: Additional studies/ records that were reviewed today include:  Review of the above records demonstrates:   REASON FOR VISIT  Referred by Dr.Delorse Shane Wel[place] Flakes.        TESTS  Imaging: Computed Tomographic Angiography:  Cardiac multidetector CT was performed paying particular attention to the coronary arteries for the diagnosis of: Angina Pectoris. Diagnostic Drugs:  Administered iohexol (Omnipaque) through an antecubital vein and images from the examination were analyzed for the presence and extent of coronary artery disease, using 3D image processing software. 100 mL of non-ionic contrast (Omnipaque) was used.        TEST CONCLUSIONS  Quality of study: Good  1-Calcium score: 414.8  2-Right dominant system.  3-LIMA not seen/probably is occluded. SVG to OM and R/M are patient. LAD and RCA have mild irregulatities, treat medically.      Adrian Blackwater MD  Electronically signed by: Adrian Blackwater     Date: 08/26/2020 14:06 TESTS    [facility] [address] [place], [place] [address] [phone] STUDY:  Gated Stress / Rest Myocardial Perfusion Imaging Tomographic (SPECT) Including attenuation correction Wall Motion, Left Ventricular Ejection Fraction By Gated  Technique.Persantine Stress Test. SEX: Male  WEIGHT: 209 lbs  HEIGHT: 72 in     ARMS UP: YES/NO  REFERRING PHYSICIAN: Dr.Nalla Purdy Wel[place] Flakes                                                                                                                                                                                                                       INDICATION FOR STUDY: CP                                                                                                                                                                                                                   TECHNIQUE:  Approximately 20 minutes following the intravenous administration of 10.1 mCi of Tc-24m Sestamibi after stress testing in a reclined supine position with arms above their head if able to do so, gated SPECT imaging of the heart was performed. After about a 2hr break, the patient was injected intravenously with 31.8 mCi of Tc-77m Sestamibi.  Approximately 45 minutes later in the same position as stress imaging SPECT rest imaging of the heart was performed.  STRESS BY:  Adrian Blackwater, MD PROTOCOL:  Persantine  DOSE ADMIN: 10 CC    ROUTE OF ADMINISTRATION: IV  MAX PRED HR: 158                     85%: 134               75%: 119                                                                                                                   RESTING BP: 132/84   RESTING HR: 82   PEAK BP: 128/80  PEAK HR:  82                                                                   EXERCISE DURATION:    4 min injection                                            REASON FOR TEST TERMINATION:    Protocol end                                                                                                                               SYMPTOMS:   None  EKG RESULTS: NSR. 74/min. No significant ST changes with persantine.                                                              IMAGE QUALITY:  Good                                                                                                                                                                                                                                                                                                                                 PERFUSION/WALL MOTION FINDINGS:  EF = 72%. No perfusion defects, normal wall motion.                                                                          IMPRESSION:  Normal stress test with normal LVEF.  Adrian Blackwater, MD Stress Interpreting Physician / Nuclear Interpreting Physician                Adrian Blackwater MD  Electronically signed by: Adrian Blackwater     Date: 10/27/2021 10:49 REASON FOR VISIT  Visit for: Echocardiogram/SOB  Sex: Male  wt= 212   lbs.  BP=124/82  Height=71    inches.        TESTS  Imaging: Echocardiogram:  An echocardiogram in (2-d) mode was performed and in Doppler mode with color flow velocity mapping was performed. The aortic valve cusps are abnormal 1.9  cm, flow velocity 1.27    m/s, and systolic calculated mean flow gradient 3  mmHg. Mitral valve diastolic peak flow velocity E .701      m/s and E/A ratio 0.8. Aortic root diameter 3.4  cm. The LVOT internal diameter 3.7    cm and flow velocity was abnormal 1.05   m/s. LV systolic dimension 2.08   cm, diastolic 4.27  cm, posterior wall thickness 1.11   cm, fractional shortening 51.3  %, and EF 82.7 %. IVS thickness 0.964   cm. LA dimension 3.6 cm. Mitral Valve has Trace Regurgitation. Tricuspid Valve has Trace Regurgitation.     ASSESSMENT  Technically adequate study.  Normal chamber sizes.  Normal left ventricular systolic function.  Mild left ventricular hypertrophy with GRADE 1 (relaxation abnormality) diastolic dysfunction.  Normal right ventricular systolic function.  Normal right ventricular diastolic function.  Normal left ventricular wall motion.  Normal right ventricular wall motion.  Trace tricuspid regurgitation.  Normal pulmonary artery pressure.  Trace mitral regurgitation.  No pericardial effusion.  Mild LVH.     THERAPY   Referring physician: Laurier Nancy  Sonographer: Adrian Blackwater.      Adrian Blackwater MD  Electronically signed by: Adrian Blackwater     Date: 10/02/2021 08:57     No data to display            ASSESSMENT AND PLAN:    ICD-10-CM   1. Coronary artery disease involving autologous vein coronary bypass graft without angina pectoris  I25.810    Denies chest pain, stress test last year was negative.    2. SOB (shortness of breath)  R06.02     3. Mixed hyperlipidemia  E78.2     4. Primary hypertension  I10     5. Nonrheumatic mitral valve regurgitation  I34.0        Problem List Items Addressed This Visit   None Visit Diagnoses     Coronary artery disease involving autologous vein coronary bypass graft without angina pectoris    -  Primary   Denies chest pain, stress test last year was negative.   SOB (shortness of breath)       Mixed hyperlipidemia       Primary hypertension       Nonrheumatic mitral valve regurgitation              Disposition:   Return in about 3 months (around 03/01/2023).    Total time spent: 30  minutes  Signed,  Adrian Blackwater, MD  11/29/2022 9:22 AM    [facility]

## 2022-12-16 ENCOUNTER — Other Ambulatory Visit: Payer: Self-pay | Admitting: Cardiovascular Disease

## 2023-02-12 ENCOUNTER — Other Ambulatory Visit: Payer: Self-pay | Admitting: Cardiovascular Disease

## 2023-02-28 ENCOUNTER — Encounter: Payer: Self-pay | Admitting: Cardiovascular Disease

## 2023-02-28 ENCOUNTER — Ambulatory Visit (INDEPENDENT_AMBULATORY_CARE_PROVIDER_SITE_OTHER): Payer: Self-pay | Admitting: Cardiovascular Disease

## 2023-02-28 VITALS — BP 124/65 | HR 70 | Ht 72.0 in | Wt 202.6 lb

## 2023-02-28 DIAGNOSIS — I10 Essential (primary) hypertension: Secondary | ICD-10-CM

## 2023-02-28 DIAGNOSIS — I340 Nonrheumatic mitral (valve) insufficiency: Secondary | ICD-10-CM

## 2023-02-28 DIAGNOSIS — I25810 Atherosclerosis of coronary artery bypass graft(s) without angina pectoris: Secondary | ICD-10-CM

## 2023-02-28 DIAGNOSIS — R0602 Shortness of breath: Secondary | ICD-10-CM

## 2023-02-28 DIAGNOSIS — E782 Mixed hyperlipidemia: Secondary | ICD-10-CM

## 2023-02-28 NOTE — Progress Notes (Signed)
Cardiology Office Note   Date:  02/28/2023   ID:  Elijah Fitzpatrick, DOB [birth year], MRN [id number]  PCP:  Patient, No Pcp Per  Cardiologist:  Adrian Blackwater, MD      History of Present Illness: Elijah Fitzpatrick is a 62 y.o. male who presents for  Chief Complaint  Patient presents with  . Follow-up    Doing well     Past Medical History:  Diagnosis Date  . Arthritis 2000  . Blocked artery   . Coronary artery disease   . Hx of cardiac cath 2012  . Hypertension   . Myocardial infarction (HCC)    x3 in 2013     Past Surgical History:  Procedure Laterality Date  . BLADDER REPAIR  12/18/2015   Procedure: BLADDER REPAIR;  Surgeon: Gladis Riffle, MD;  Location: [facility];  Service: General;;  . HERNIA REPAIR     Bilateral open in 1990s  . INGUINAL HERNIA REPAIR Right 12/18/2015   Procedure: HERNIA REPAIR INGUINAL ADULT;  Surgeon: Gladis Riffle, MD;  Location: [facility];  Service: General;  Laterality: Right;  . LEFT HEART CATH AND CORONARY ANGIOGRAPHY Left 02/16/2018   Procedure: LEFT HEART CATH AND CORONARY ANGIOGRAPHY;  Surgeon: Laurier Nancy, MD;  Location: [facility];  Service: Cardiovascular;  Laterality: Left;     Current Outpatient Medications  Medication Sig Dispense Refill  . aspirin EC 81 MG tablet Take 81 mg by mouth daily.    Marland Kitchen atorvastatin (LIPITOR) 80 MG tablet Take 1 tablet (80 mg total) by mouth daily. 90 tablet 3  . hydrochlorothiazide (HYDRODIURIL) 12.5 MG tablet Take 1 tablet by mouth once daily 90 tablet 0  . isosorbide mononitrate (IMDUR) 60 MG 24 hr tablet Take 1 tablet by mouth once daily 90 tablet 0  . losartan (COZAAR) 50 MG tablet Take 1 tablet by mouth once daily 90 tablet 0  . metoprolol succinate (TOPROL XL) 25 MG 24 hr tablet Take 1 tablet (25 mg total) by mouth daily. 30 tablet 11  . nitroGLYCERIN (NITROSTAT) 0.4 MG SL tablet Place 0.4 mg under the tongue every 5 (five) minutes as needed for chest pain.    Marland Kitchen omeprazole  (PRILOSEC) 20 MG capsule Take 20 mg by mouth daily. (Patient not taking: Reported on 02/28/2023)    . omeprazole (PRILOSEC) 40 MG capsule Take 1 capsule (40 mg total) by mouth daily. 30 capsule 2  . ondansetron (ZOFRAN) 4 MG tablet Take 1 tablet (4 mg total) by mouth every 6 (six) hours as needed for nausea. (Patient not taking: Reported on 03/03/2017) 20 tablet 0  . ranolazine (RANEXA) 500 MG 12 hr tablet Take 500 mg by mouth 2 (two) times daily.     No current facility-administered medications for this visit.    Allergies:   Iodinated contrast media    Social History:   reports that he quit smoking about 5 years ago. His smoking use included cigarettes. He started smoking about 45 years ago. He has a 40 pack-year smoking history. He has quit using smokeless tobacco. He reports that he does not drink alcohol and does not use drugs.   Family History:  family history includes Cancer in his maternal uncle; Heart disease in his father.    ROS:     Review of Systems  Constitutional: Negative.   HENT: Negative.    Eyes: Negative.   Respiratory: Negative.    Gastrointestinal: Negative.   Genitourinary: Negative.   Musculoskeletal:  Negative.   Skin: Negative.   Neurological: Negative.   Endo/Heme/Allergies: Negative.   Psychiatric/Behavioral: Negative.    All other systems reviewed and are negative.     All other systems are reviewed and negative.    PHYSICAL EXAM: VS:  BP 124/65   Pulse 70   Ht 6' (1.829 m)   Wt 202 lb 9.6 oz (91.9 kg)   SpO2 98%   BMI 27.48 kg/m  , BMI Body mass index is 27.48 kg/m. Last weight:  Wt Readings from Last 3 Encounters:  02/28/23 202 lb 9.6 oz (91.9 kg)  11/29/22 205 lb (93 kg)  02/16/18 207 lb (93.9 kg)     Physical Exam Vitals reviewed.  Constitutional:      Appearance: Normal appearance. He is normal weight.  HENT:     Head: Normocephalic.     Nose: Nose normal.     Mouth/Throat:     Mouth: Mucous membranes are moist.  Eyes:      Pupils: Pupils are equal, round, and reactive to light.  Cardiovascular:     Rate and Rhythm: Normal rate and regular rhythm.     Pulses: Normal pulses.     Heart sounds: Normal heart sounds.  Pulmonary:     Effort: Pulmonary effort is normal.  Abdominal:     General: Abdomen is flat. Bowel sounds are normal.  Musculoskeletal:        General: Normal range of motion.     Cervical back: Normal range of motion.  Skin:    General: Skin is warm.  Neurological:     General: No focal deficit present.     Mental Status: He is alert.  Psychiatric:        Mood and Affect: Mood normal.      EKG:   Recent Labs: No results found for requested labs within last 365 days.    Lipid Panel No results found for: "CHOL", "TRIG", "HDL", "CHOLHDL", "VLDL", "LDLCALC", "LDLDIRECT"    Other studies Reviewed: Additional studies/ records that were reviewed today include:  Review of the above records demonstrates:       No data to display            ASSESSMENT AND PLAN:    ICD-10-CM   1. Coronary artery disease involving autologous vein coronary bypass graft without angina pectoris  I25.810 MYOCARDIAL PERFUSION IMAGING    PCV ECHOCARDIOGRAM COMPLETE    Comprehensive metabolic panel    Lipid Profile   Echo, stress test    2. SOB (shortness of breath)  R06.02 MYOCARDIAL PERFUSION IMAGING    PCV ECHOCARDIOGRAM COMPLETE    Comprehensive metabolic panel    Lipid Profile    3. Mixed hyperlipidemia  E78.2 MYOCARDIAL PERFUSION IMAGING    PCV ECHOCARDIOGRAM COMPLETE    Comprehensive metabolic panel    Lipid Profile    4. Primary hypertension  I10 MYOCARDIAL PERFUSION IMAGING    PCV ECHOCARDIOGRAM COMPLETE    Comprehensive metabolic panel    Lipid Profile    5. Nonrheumatic mitral valve regurgitation  I34.0 MYOCARDIAL PERFUSION IMAGING    PCV ECHOCARDIOGRAM COMPLETE    Comprehensive metabolic panel    Lipid Profile       Problem List Items Addressed This Visit   None Visit  Diagnoses     Coronary artery disease involving autologous vein coronary bypass graft without angina pectoris    -  Primary   Echo, stress test   Relevant Orders   MYOCARDIAL PERFUSION IMAGING  PCV ECHOCARDIOGRAM COMPLETE   Comprehensive metabolic panel   Lipid Profile   SOB (shortness of breath)       Relevant Orders   MYOCARDIAL PERFUSION IMAGING   PCV ECHOCARDIOGRAM COMPLETE   Comprehensive metabolic panel   Lipid Profile   Mixed hyperlipidemia       Relevant Orders   MYOCARDIAL PERFUSION IMAGING   PCV ECHOCARDIOGRAM COMPLETE   Comprehensive metabolic panel   Lipid Profile   Primary hypertension       Relevant Orders   MYOCARDIAL PERFUSION IMAGING   PCV ECHOCARDIOGRAM COMPLETE   Comprehensive metabolic panel   Lipid Profile   Nonrheumatic mitral valve regurgitation       Relevant Orders   MYOCARDIAL PERFUSION IMAGING   PCV ECHOCARDIOGRAM COMPLETE   Comprehensive metabolic panel   Lipid Profile          Disposition:   Return in about 4 weeks (around 03/28/2023) for echo, stress test and f/u.    Total time spent: 35 minutes  Signed,  Adrian Blackwater, MD  02/28/2023 9:25 AM    [facility]

## 2023-03-04 ENCOUNTER — Other Ambulatory Visit: Payer: Self-pay

## 2023-03-05 LAB — LIPID PANEL
Chol/HDL Ratio: 2.1 ratio (ref 0.0–5.0)
Cholesterol, Total: 117 mg/dL (ref 100–199)
HDL: 55 mg/dL (ref >39)
LDL Chol Calc (NIH): 52 mg/dL (ref 0–99)
Triglycerides: 35 mg/dL (ref 0–149)
VLDL Cholesterol Cal: 10 mg/dL (ref 5–40)

## 2023-03-05 LAB — COMPREHENSIVE METABOLIC PANEL
ALT: 15 IU/L (ref 0–44)
AST: 22 IU/L (ref 0–40)
Albumin: 4.4 g/dL (ref 3.9–4.9)
Alkaline Phosphatase: 42 IU/L — ABNORMAL LOW (ref 44–121)
BUN/Creatinine Ratio: 13 (ref 10–24)
BUN: 11 mg/dL (ref 8–27)
Bilirubin Total: 0.7 mg/dL (ref 0.0–1.2)
CO2: 24 mmol/L (ref 20–29)
Calcium: 9.6 mg/dL (ref 8.6–10.2)
Chloride: 101 mmol/L (ref 96–106)
Creatinine, Ser: 0.87 mg/dL (ref 0.76–1.27)
Globulin, Total: 2.3 g/dL (ref 1.5–4.5)
Glucose: 104 mg/dL — ABNORMAL HIGH (ref 70–99)
Potassium: 4.6 mmol/L (ref 3.5–5.2)
Sodium: 141 mmol/L (ref 134–144)
Total Protein: 6.7 g/dL (ref 6.0–8.5)
eGFR: 96 mL/min/{1.73_m2} (ref >59)

## 2023-03-07 ENCOUNTER — Ambulatory Visit (INDEPENDENT_AMBULATORY_CARE_PROVIDER_SITE_OTHER): Payer: Self-pay

## 2023-03-07 DIAGNOSIS — R0602 Shortness of breath: Secondary | ICD-10-CM

## 2023-03-07 DIAGNOSIS — I361 Nonrheumatic tricuspid (valve) insufficiency: Secondary | ICD-10-CM

## 2023-03-07 DIAGNOSIS — I340 Nonrheumatic mitral (valve) insufficiency: Secondary | ICD-10-CM

## 2023-03-07 DIAGNOSIS — I25810 Atherosclerosis of coronary artery bypass graft(s) without angina pectoris: Secondary | ICD-10-CM

## 2023-03-07 DIAGNOSIS — I10 Essential (primary) hypertension: Secondary | ICD-10-CM

## 2023-03-07 DIAGNOSIS — E782 Mixed hyperlipidemia: Secondary | ICD-10-CM

## 2023-03-11 ENCOUNTER — Ambulatory Visit (INDEPENDENT_AMBULATORY_CARE_PROVIDER_SITE_OTHER): Payer: Self-pay

## 2023-03-11 DIAGNOSIS — I25810 Atherosclerosis of coronary artery bypass graft(s) without angina pectoris: Secondary | ICD-10-CM

## 2023-03-11 DIAGNOSIS — I340 Nonrheumatic mitral (valve) insufficiency: Secondary | ICD-10-CM

## 2023-03-11 DIAGNOSIS — I10 Essential (primary) hypertension: Secondary | ICD-10-CM

## 2023-03-11 DIAGNOSIS — R0602 Shortness of breath: Secondary | ICD-10-CM

## 2023-03-11 DIAGNOSIS — E782 Mixed hyperlipidemia: Secondary | ICD-10-CM

## 2023-03-11 MED ORDER — TECHNETIUM TC 99M SESTAMIBI GENERIC - CARDIOLITE
34.0000 | Freq: Once | INTRAVENOUS | Status: AC | PRN
  Administered 2023-03-11: 34 via INTRAVENOUS

## 2023-03-11 MED ORDER — TECHNETIUM TC 99M SESTAMIBI GENERIC - CARDIOLITE
9.9000 | Freq: Once | INTRAVENOUS | Status: AC | PRN
  Administered 2023-03-11: 9.9 via INTRAVENOUS

## 2023-03-18 ENCOUNTER — Ambulatory Visit (INDEPENDENT_AMBULATORY_CARE_PROVIDER_SITE_OTHER): Payer: Self-pay | Admitting: Cardiovascular Disease

## 2023-03-18 ENCOUNTER — Encounter: Payer: Self-pay | Admitting: Cardiovascular Disease

## 2023-03-18 VITALS — BP 130/90 | HR 57 | Ht 72.0 in | Wt 203.2 lb

## 2023-03-18 DIAGNOSIS — E782 Mixed hyperlipidemia: Secondary | ICD-10-CM

## 2023-03-18 DIAGNOSIS — I25810 Atherosclerosis of coronary artery bypass graft(s) without angina pectoris: Secondary | ICD-10-CM

## 2023-03-18 DIAGNOSIS — I259 Chronic ischemic heart disease, unspecified: Secondary | ICD-10-CM

## 2023-03-18 DIAGNOSIS — I340 Nonrheumatic mitral (valve) insufficiency: Secondary | ICD-10-CM

## 2023-03-18 DIAGNOSIS — R0602 Shortness of breath: Secondary | ICD-10-CM

## 2023-03-18 DIAGNOSIS — I10 Essential (primary) hypertension: Secondary | ICD-10-CM

## 2023-03-18 DIAGNOSIS — R0789 Other chest pain: Secondary | ICD-10-CM

## 2023-03-18 NOTE — Progress Notes (Signed)
Cardiology Office Note   Date:  03/18/2023   ID:  Elijah Fitzpatrick, DOB 15-Nov-1960, MRN [id number]  PCP:  Patient, No Pcp Per  Cardiologist:  Adrian Blackwater, MD      History of Present Illness: Elijah Fitzpatrick is a 62 y.o. male who presents for  Chief Complaint  Patient presents with   Follow-up    1 mo    Occasional sharp chest pains  Chest Pain  This is a recurrent problem. The current episode started more than 1 month ago. The problem has been waxing and waning.      Past Medical History:  Diagnosis Date   Arthritis 2000   Blocked artery    Coronary artery disease    Hx of cardiac cath 2012   Hypertension    Myocardial infarction (HCC)    x3 in 2013     Past Surgical History:  Procedure Laterality Date   BLADDER REPAIR  12/18/2015   Procedure: BLADDER REPAIR;  Surgeon: Gladis Riffle, MD;  Location: [facility] ORS;  Service: General;;   HERNIA REPAIR     Bilateral open in 1990s   INGUINAL HERNIA REPAIR Right 12/18/2015   Procedure: HERNIA REPAIR INGUINAL ADULT;  Surgeon: Gladis Riffle, MD;  Location: [facility] ORS;  Service: General;  Laterality: Right;   LEFT HEART CATH AND CORONARY ANGIOGRAPHY Left 02/16/2018   Procedure: LEFT HEART CATH AND CORONARY ANGIOGRAPHY;  Surgeon: Laurier Nancy, MD;  Location: [facility] IN[facility]IVE [facility];  Service: Cardiovascular;  Laterality: Left;     Current Outpatient Medications  Medication Sig Dispense Refill   aspirin EC 81 MG tablet Take 81 mg by mouth daily.     atorvastatin (LIPITOR) 80 MG tablet Take 1 tablet (80 mg total) by mouth daily. 90 tablet 3   hydrochlorothiazide (HYDRODIURIL) 12.5 MG tablet Take 1 tablet by mouth once daily 90 tablet 0   isosorbide mononitrate (IMDUR) 60 MG 24 hr tablet Take 1 tablet by mouth once daily 90 tablet 0   losartan (COZAAR) 50 MG tablet Take 1 tablet by mouth once daily 90 tablet 0   metoprolol succinate (TOPROL XL) 25 MG 24 hr tablet Take 1 tablet (25 mg total) by mouth daily. 30 tablet 11    nitroGLYCERIN (NITROSTAT) 0.4 MG SL tablet Place 0.4 mg under the tongue every 5 (five) minutes as needed for chest pain.     omeprazole (PRILOSEC) 20 MG capsule Take 20 mg by mouth daily. (Patient not taking: Reported on 02/28/2023)     omeprazole (PRILOSEC) 40 MG capsule Take 1 capsule (40 mg total) by mouth daily. 30 capsule 2   ondansetron (ZOFRAN) 4 MG tablet Take 1 tablet (4 mg total) by mouth every 6 (six) hours as needed for nausea. (Patient not taking: Reported on 03/03/2017) 20 tablet 0   ranolazine (RANEXA) 500 MG 12 hr tablet Take 500 mg by mouth 2 (two) times daily.     No current facility-administered medications for this visit.    Allergies:   Iodinated contrast media    Social History:   reports that he quit smoking about 5 years ago. His smoking use included cigarettes. He started smoking about 45 years ago. He has a 40 pack-year smoking history. He has quit using smokeless tobacco. He reports that he does not drink alcohol and does not use drugs.   Family History:  family history includes Cancer in his maternal uncle; Heart disease in his father.    ROS:  Review of Systems  Constitutional: Negative.   HENT: Negative.    Eyes: Negative.   Respiratory: Negative.    Cardiovascular:  Positive for chest pain.  Gastrointestinal: Negative.   Genitourinary: Negative.   Musculoskeletal: Negative.   Skin: Negative.   Neurological: Negative.   Endo/Heme/Allergies: Negative.   Psychiatric/Behavioral: Negative.    All other systems reviewed and are negative.     All other systems are reviewed and negative.    PHYSICAL EXAM: VS:  BP (!) 130/90   Pulse (!) 57   Ht 6' (1.829 m)   Wt 203 lb 3.2 oz (92.2 kg)   SpO2 97%   BMI 27.56 kg/m  , BMI Body mass index is 27.56 kg/m. Last weight:  Wt Readings from Last 3 Encounters:  03/18/23 203 lb 3.2 oz (92.2 kg)  02/28/23 202 lb 9.6 oz (91.9 kg)  11/29/22 205 lb (93 kg)     Physical Exam Vitals reviewed.   Constitutional:      Appearance: Normal appearance. He is normal weight.  HENT:     Head: Normocephalic.     Nose: Nose normal.     Mouth/Throat:     Mouth: Mucous membranes are moist.  Eyes:     Pupils: Pupils are equal, round, and reactive to light.  Cardiovascular:     Rate and Rhythm: Normal rate and regular rhythm.     Pulses: Normal pulses.     Heart sounds: Normal heart sounds.  Pulmonary:     Effort: Pulmonary effort is normal.  Abdominal:     General: Abdomen is flat. Bowel sounds are normal.  Musculoskeletal:        General: Normal range of motion.     Cervical back: Normal range of motion.  Skin:    General: Skin is warm.  Neurological:     General: No focal deficit present.     Mental Status: He is alert.  Psychiatric:        Mood and Affect: Mood normal.       EKG:   Recent Labs: 03/04/2023: ALT 15; BUN 11; Creatinine, Ser 0.87; Potassium 4.6; Sodium 141    Lipid Panel    Component Value Date/Time   CHOL 117 03/04/2023 1137   TRIG 35 03/04/2023 1137   HDL 55 03/04/2023 1137   CHOLHDL 2.1 03/04/2023 1137   LDLCALC 52 03/04/2023 1137      Other studies Reviewed: Additional studies/ records that were reviewed today include:  Review of the above records demonstrates:       No data to display            ASSESSMENT AND PLAN:    ICD-10-CM   1. Coronary artery disease involving autologous vein coronary bypass graft without angina pectoris  I25.810 CT CORONARY MORPH W/CTA COR W/SCORE W/CA W/CM &/OR WO/CM    2. SOB (shortness of breath)  R06.02 CT CORONARY MORPH W/CTA COR W/SCORE W/CA W/CM &/OR WO/CM    3. Mixed hyperlipidemia  E78.2 CT CORONARY MORPH W/CTA COR W/SCORE W/CA W/CM &/OR WO/CM    4. Primary hypertension  I10 CT CORONARY MORPH W/CTA COR W/SCORE W/CA W/CM &/OR WO/CM    5. Nonrheumatic mitral valve regurgitation  I34.0 CT CORONARY MORPH W/CTA COR W/SCORE W/CA W/CM &/OR WO/CM    6. Other chest pain  R07.89 CT CORONARY MORPH W/CTA  COR W/SCORE W/CA W/CM &/OR WO/CM   equivacal stress test, advise CCTA    7. Chest pain due to myocardial ischemia, unspecified  ischemic chest pain type  I25.9 CT CORONARY MORPH W/CTA COR W/SCORE W/CA W/CM &/OR WO/CM       Problem List Items Addressed This Visit   None Visit Diagnoses     Coronary artery disease involving autologous vein coronary bypass graft without angina pectoris    -  Primary   Relevant Orders   CT CORONARY MORPH W/CTA COR W/SCORE W/CA W/CM &/OR WO/CM   SOB (shortness of breath)       Relevant Orders   CT CORONARY MORPH W/CTA COR W/SCORE W/CA W/CM &/OR WO/CM   Mixed hyperlipidemia       Relevant Orders   CT CORONARY MORPH W/CTA COR W/SCORE W/CA W/CM &/OR WO/CM   Primary hypertension       Relevant Orders   CT CORONARY MORPH W/CTA COR W/SCORE W/CA W/CM &/OR WO/CM   Nonrheumatic mitral valve regurgitation       Relevant Orders   CT CORONARY MORPH W/CTA COR W/SCORE W/CA W/CM &/OR WO/CM   Other chest pain       equivacal stress test, advise CCTA   Relevant Orders   CT CORONARY MORPH W/CTA COR W/SCORE W/CA W/CM &/OR WO/CM   Chest pain due to myocardial ischemia, unspecified ischemic chest pain type       Relevant Orders   CT CORONARY MORPH W/CTA COR W/SCORE W/CA W/CM &/OR WO/CM          Disposition:   Return in about 4 weeks (around 04/15/2023) for CCTA and f/u.    Total time spent: 35 minutes  Signed,  Adrian Blackwater, MD  03/18/2023 10:20 AM    [facility]

## 2023-04-05 ENCOUNTER — Ambulatory Visit (INDEPENDENT_AMBULATORY_CARE_PROVIDER_SITE_OTHER): Payer: Self-pay

## 2023-04-05 DIAGNOSIS — R0789 Other chest pain: Secondary | ICD-10-CM

## 2023-04-05 DIAGNOSIS — I10 Essential (primary) hypertension: Secondary | ICD-10-CM

## 2023-04-05 DIAGNOSIS — I25810 Atherosclerosis of coronary artery bypass graft(s) without angina pectoris: Secondary | ICD-10-CM

## 2023-04-05 DIAGNOSIS — I259 Chronic ischemic heart disease, unspecified: Secondary | ICD-10-CM

## 2023-04-05 DIAGNOSIS — R0602 Shortness of breath: Secondary | ICD-10-CM

## 2023-04-05 DIAGNOSIS — E782 Mixed hyperlipidemia: Secondary | ICD-10-CM

## 2023-04-05 DIAGNOSIS — I340 Nonrheumatic mitral (valve) insufficiency: Secondary | ICD-10-CM

## 2023-04-05 MED ORDER — IOHEXOL 350 MG/ML SOLN
100.0000 mL | Freq: Once | INTRAVENOUS | Status: AC | PRN
  Administered 2023-04-05: 100 mL via INTRAVENOUS

## 2023-04-22 ENCOUNTER — Encounter: Payer: Self-pay | Admitting: Cardiovascular Disease

## 2023-04-22 ENCOUNTER — Ambulatory Visit (INDEPENDENT_AMBULATORY_CARE_PROVIDER_SITE_OTHER): Payer: Self-pay | Admitting: Cardiovascular Disease

## 2023-04-22 VITALS — BP 119/82 | HR 65 | Ht 71.0 in | Wt 202.0 lb

## 2023-04-22 DIAGNOSIS — E782 Mixed hyperlipidemia: Secondary | ICD-10-CM

## 2023-04-22 DIAGNOSIS — I340 Nonrheumatic mitral (valve) insufficiency: Secondary | ICD-10-CM

## 2023-04-22 DIAGNOSIS — R0602 Shortness of breath: Secondary | ICD-10-CM

## 2023-04-22 DIAGNOSIS — I10 Essential (primary) hypertension: Secondary | ICD-10-CM

## 2023-04-22 DIAGNOSIS — I25810 Atherosclerosis of coronary artery bypass graft(s) without angina pectoris: Secondary | ICD-10-CM

## 2023-04-22 DIAGNOSIS — R0789 Other chest pain: Secondary | ICD-10-CM

## 2023-04-22 NOTE — Progress Notes (Signed)
Cardiology Office Note   Date:  04/22/2023   ID:  Elijah Fitzpatrick, DOB [birth year], MRN [id number]  PCP:  Patient, No Pcp Per  Cardiologist:  Adrian Blackwater, MD      History of Present Illness: Elijah Fitzpatrick is a 62 y.o. male who presents for  Chief Complaint  Patient presents with   Follow-up    Occasional chest pain      Past Medical History:  Diagnosis Date   Arthritis 2000   Blocked artery    Coronary artery disease    Hx of cardiac cath 2012   Hypertension    Myocardial infarction (HCC)    x3 in 2013     Past Surgical History:  Procedure Laterality Date   BLADDER REPAIR  12/18/2015   Procedure: BLADDER REPAIR;  Surgeon: Gladis Riffle, MD;  Location: [facility] ORS;  Service: General;;   HERNIA REPAIR     Bilateral open in 1990s   INGUINAL HERNIA REPAIR Right 12/18/2015   Procedure: HERNIA REPAIR INGUINAL ADULT;  Surgeon: Gladis Riffle, MD;  Location: [facility] ORS;  Service: General;  Laterality: Right;   LEFT HEART CATH AND CORONARY ANGIOGRAPHY Left 02/16/2018   Procedure: LEFT HEART CATH AND CORONARY ANGIOGRAPHY;  Surgeon: Laurier Nancy, MD;  Location: [facility] IN[facility];  Service: Cardiovascular;  Laterality: Left;     Current Outpatient Medications  Medication Sig Dispense Refill   aspirin EC 81 MG tablet Take 81 mg by mouth daily.     atorvastatin (LIPITOR) 80 MG tablet Take 1 tablet (80 mg total) by mouth daily. 90 tablet 3   hydrochlorothiazide (HYDRODIURIL) 12.5 MG tablet Take 1 tablet by mouth once daily 90 tablet 0   isosorbide mononitrate (IMDUR) 60 MG 24 hr tablet Take 1 tablet by mouth once daily 90 tablet 0   losartan (COZAAR) 50 MG tablet Take 1 tablet by mouth once daily 90 tablet 0   metoprolol succinate (TOPROL XL) 25 MG 24 hr tablet Take 1 tablet (25 mg total) by mouth daily. 30 tablet 11   nitroGLYCERIN (NITROSTAT) 0.4 MG SL tablet Place 0.4 mg under the tongue every 5 (five) minutes as needed for chest pain.     omeprazole (PRILOSEC) 20  MG capsule Take 20 mg by mouth daily. (Patient not taking: Reported on 02/28/2023)     omeprazole (PRILOSEC) 40 MG capsule Take 1 capsule (40 mg total) by mouth daily. 30 capsule 2   ondansetron (ZOFRAN) 4 MG tablet Take 1 tablet (4 mg total) by mouth every 6 (six) hours as needed for nausea. (Patient not taking: Reported on 03/03/2017) 20 tablet 0   ranolazine (RANEXA) 500 MG 12 hr tablet Take 500 mg by mouth 2 (two) times daily.     No current facility-administered medications for this visit.    Allergies:   Iodinated contrast media    Social History:   reports that he quit smoking about 5 years ago. His smoking use included cigarettes. He started smoking about 45 years ago. He has a 40 pack-year smoking history. He has quit using smokeless tobacco. He reports that he does not drink alcohol and does not use drugs.   Family History:  family history includes Cancer in his maternal uncle; Heart disease in his father.    ROS:     Review of Systems  Constitutional: Negative.   HENT: Negative.    Eyes: Negative.   Respiratory: Negative.    Gastrointestinal: Negative.   Genitourinary: Negative.  Musculoskeletal: Negative.   Skin: Negative.   Neurological: Negative.   Endo/Heme/Allergies: Negative.   Psychiatric/Behavioral: Negative.    All other systems reviewed and are negative.     All other systems are reviewed and negative.    PHYSICAL EXAM: VS:  BP 119/82 (BP Location: Left Arm)   Pulse 65   Ht 5\' 11"  (1.803 m)   Wt 202 lb (91.6 kg)   SpO2 99%   BMI 28.17 kg/m  , BMI Body mass index is 28.17 kg/m. Last weight:  Wt Readings from Last 3 Encounters:  04/22/23 202 lb (91.6 kg)  03/18/23 203 lb 3.2 oz (92.2 kg)  02/28/23 202 lb 9.6 oz (91.9 kg)     Physical Exam Vitals reviewed.  Constitutional:      Appearance: Normal appearance. He is normal weight.  HENT:     Head: Normocephalic.     Nose: Nose normal.     Mouth/Throat:     Mouth: Mucous membranes are moist.   Eyes:     Pupils: Pupils are equal, round, and reactive to light.  Cardiovascular:     Rate and Rhythm: Normal rate and regular rhythm.     Pulses: Normal pulses.     Heart sounds: Normal heart sounds.  Pulmonary:     Effort: Pulmonary effort is normal.  Abdominal:     General: Abdomen is flat. Bowel sounds are normal.  Musculoskeletal:        General: Normal range of motion.     Cervical back: Normal range of motion.  Skin:    General: Skin is warm.  Neurological:     General: No focal deficit present.     Mental Status: He is alert.  Psychiatric:        Mood and Affect: Mood normal.       EKG:   Recent Labs: 03/04/2023: ALT 15; BUN 11; Creatinine, Ser 0.87; Potassium 4.6; Sodium 141    Lipid Panel    Component Value Date/Time   CHOL 117 03/04/2023 1137   TRIG 35 03/04/2023 1137   HDL 55 03/04/2023 1137   CHOLHDL 2.1 03/04/2023 1137   LDLCALC 52 03/04/2023 1137      Other studies Reviewed: Additional studies/ records that were reviewed today include:  Review of the above records demonstrates:       No data to display            ASSESSMENT AND PLAN:    ICD-10-CM   1. Coronary artery disease involving autologous vein coronary bypass graft without angina pectoris  I25.810     2. SOB (shortness of breath)  R06.02     3. Mixed hyperlipidemia  E78.2     4. Primary hypertension  I10     5. Nonrheumatic mitral valve regurgitation  I34.0     6. Other chest pain  R07.89    CCTA showed ca score 800, grafts patent       Problem List Items Addressed This Visit   None Visit Diagnoses     Coronary artery disease involving autologous vein coronary bypass graft without angina pectoris    -  Primary   SOB (shortness of breath)       Mixed hyperlipidemia       Primary hypertension       Nonrheumatic mitral valve regurgitation       Other chest pain       CCTA showed ca score 800, grafts patent  Disposition:   Return in about 3 months  (around 07/23/2023).    Total time spent: 35 minutes  Signed,  Adrian Blackwater, MD  04/22/2023 9:14 AM    [facility]

## 2023-05-05 ENCOUNTER — Other Ambulatory Visit: Payer: Self-pay | Admitting: Cardiovascular Disease

## 2023-05-08 ENCOUNTER — Other Ambulatory Visit: Payer: Self-pay | Admitting: Cardiovascular Disease

## 2023-06-09 ENCOUNTER — Other Ambulatory Visit: Payer: Self-pay | Admitting: Cardiovascular Disease

## 2023-07-01 ENCOUNTER — Emergency Department
Admission: EM | Admit: 2023-07-01 | Discharge: 2023-07-16 | Disposition: E | Payer: Self-pay | Attending: Emergency Medicine | Admitting: Emergency Medicine

## 2023-07-01 DIAGNOSIS — S0081XA Abrasion of other part of head, initial encounter: Secondary | ICD-10-CM | POA: Diagnosis not present

## 2023-07-01 DIAGNOSIS — S51811A Laceration without foreign body of right forearm, initial encounter: Secondary | ICD-10-CM | POA: Diagnosis not present

## 2023-07-01 DIAGNOSIS — Y92410 Unspecified street and highway as the place of occurrence of the external cause: Secondary | ICD-10-CM | POA: Insufficient documentation

## 2023-07-01 DIAGNOSIS — I469 Cardiac arrest, cause unspecified: Secondary | ICD-10-CM | POA: Insufficient documentation

## 2023-07-01 DIAGNOSIS — I2510 Atherosclerotic heart disease of native coronary artery without angina pectoris: Secondary | ICD-10-CM | POA: Insufficient documentation

## 2023-07-01 DIAGNOSIS — I10 Essential (primary) hypertension: Secondary | ICD-10-CM | POA: Insufficient documentation

## 2023-07-01 DIAGNOSIS — S59911A Unspecified injury of right forearm, initial encounter: Secondary | ICD-10-CM | POA: Diagnosis present

## 2023-07-01 MED ORDER — CALCIUM CHLORIDE 10 % IV SOLN
INTRAVENOUS | Status: AC | PRN
Start: 2023-07-01 — End: 2023-07-01
  Administered 2023-07-01: 1 g via INTRAVENOUS

## 2023-07-01 MED ORDER — EPINEPHRINE 1 MG/10ML IJ SOSY
PREFILLED_SYRINGE | INTRAMUSCULAR | Status: AC | PRN
Start: 2023-07-01 — End: 2023-07-01
  Administered 2023-07-01: 1 mg via INTRAVENOUS

## 2023-07-01 MED ORDER — EPINEPHRINE 1 MG/10ML IJ SOSY
PREFILLED_SYRINGE | INTRAMUSCULAR | Status: AC | PRN
Start: 2023-07-01 — End: 2023-07-01
  Administered 2023-07-01 (×2): 1 mg via INTRAVENOUS

## 2023-07-01 MED ORDER — SODIUM BICARBONATE 8.4 % IV SOLN
INTRAVENOUS | Status: AC | PRN
  Administered 2023-07-01: 100 meq via INTRAVENOUS

## 2023-07-16 NOTE — Code Documentation (Signed)
Pulse check. PEA. CPR restarted

## 2023-07-16 NOTE — Progress Notes (Signed)
    1500  Spiritual Encounters  Type of Visit Follow up  Conversation partners present during encounter Nurse  Referral source Chaplain team  Reason for visit Patient death  OnCall Visit Yes   Chaplain Resident attempted to visit the deceased patient's family per the Chaplain Intern who visited earlier in the day to offer care.  Chaplain Resident looked for family in the waiting areas and checked with staff who suggested family may have left for the evening.  Chaplain Resident shared with the staff that they can call or page the Chaplain if the family returns and would benefit from spiritual care.   Rev. Rana M. Earlene Plater, MDiv. Chaplain Resident  [facility]
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
## 2023-07-16 NOTE — ED Notes (Signed)
Family member at bedside.

## 2023-07-16 NOTE — Code Documentation (Signed)
Pulse check. Pt in PEA. CPR restarted

## 2023-07-16 NOTE — ED Notes (Signed)
Chaplain paged  

## 2023-07-16 NOTE — Code Documentation (Signed)
EDP using [place] for cardiac activity

## 2023-07-16 NOTE — Progress Notes (Signed)
    1306  Spiritual Encounters  Type of Visit Initial  Care provided to: Family  Conversation partners present during encounter Nurse  Referral source Physician  Reason for visit Patient death  OnCall Visit Yes  Interventions  Spiritual Care Interventions Made Established relationship of care and support;Compassionate presence;Normalization of emotions;Bereavement/grief support;Prayer;Supported grief process  Intervention Outcomes  Outcomes Awareness of support  Spiritual Care Plan  Spiritual Care Issues Still Outstanding Referring to oncoming chaplain for further support   Made family aware that chaplain service remains available

## 2023-07-16 NOTE — ED Notes (Signed)
Elijah Fitzpatrick stopped. Pulse check. No pulse. CPR intited

## 2023-07-16 NOTE — Code Documentation (Addendum)
Patient time of death occurred at 1132am. Called by EDP Siadecki
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
## 2023-07-16 DEATH — deceased

## 2023-07-22 ENCOUNTER — Ambulatory Visit: Payer: Self-pay | Admitting: Cardiovascular Disease

## 2023-08-18 ENCOUNTER — Other Ambulatory Visit: Payer: Self-pay | Admitting: Cardiovascular Disease
